# Patient Record
Sex: Male | Born: 1935 | Race: Black or African American | Hispanic: No | Marital: Married | Smoking: Former smoker
Health system: Southern US, Community
[De-identification: ages and names within clinical notes are randomized; demographics above are authoritative.]

## PROBLEM LIST (undated history)

## (undated) DIAGNOSIS — I10 Essential (primary) hypertension: Secondary | ICD-10-CM

## (undated) DIAGNOSIS — G459 Transient cerebral ischemic attack, unspecified: Secondary | ICD-10-CM

## (undated) DIAGNOSIS — I619 Nontraumatic intracerebral hemorrhage, unspecified: Secondary | ICD-10-CM

## (undated) DIAGNOSIS — C61 Malignant neoplasm of prostate: Secondary | ICD-10-CM

## (undated) DIAGNOSIS — I639 Cerebral infarction, unspecified: Secondary | ICD-10-CM

## (undated) DIAGNOSIS — D61818 Other pancytopenia: Secondary | ICD-10-CM

## (undated) DIAGNOSIS — Z515 Encounter for palliative care: Secondary | ICD-10-CM

## (undated) DIAGNOSIS — J9801 Acute bronchospasm: Secondary | ICD-10-CM

## (undated) DIAGNOSIS — C787 Secondary malignant neoplasm of liver and intrahepatic bile duct: Secondary | ICD-10-CM

---

## 2000-07-10 ENCOUNTER — Encounter: Payer: Self-pay | Admitting: Surgery

## 2000-07-10 ENCOUNTER — Emergency Department (HOSPITAL_COMMUNITY): Admission: EM | Admit: 2000-07-10 | Discharge: 2000-07-10 | Payer: Self-pay | Admitting: Emergency Medicine

## 2004-12-08 ENCOUNTER — Emergency Department (HOSPITAL_COMMUNITY): Admission: EM | Admit: 2004-12-08 | Discharge: 2004-12-09 | Payer: Self-pay | Admitting: Emergency Medicine

## 2005-02-19 ENCOUNTER — Inpatient Hospital Stay (HOSPITAL_COMMUNITY): Admission: EM | Admit: 2005-02-19 | Discharge: 2005-02-22 | Payer: Self-pay | Admitting: Emergency Medicine

## 2005-02-19 ENCOUNTER — Ambulatory Visit: Payer: Self-pay | Admitting: Physical Medicine & Rehabilitation

## 2005-02-22 ENCOUNTER — Inpatient Hospital Stay (HOSPITAL_COMMUNITY)
Admission: RE | Admit: 2005-02-22 | Discharge: 2005-03-04 | Payer: Self-pay | Admitting: Physical Medicine & Rehabilitation

## 2005-02-24 ENCOUNTER — Encounter (INDEPENDENT_AMBULATORY_CARE_PROVIDER_SITE_OTHER): Payer: Self-pay | Admitting: Cardiovascular Disease

## 2005-03-07 ENCOUNTER — Encounter
Admission: RE | Admit: 2005-03-07 | Discharge: 2005-04-07 | Payer: Self-pay | Admitting: Physical Medicine & Rehabilitation

## 2005-04-07 ENCOUNTER — Encounter
Admission: RE | Admit: 2005-04-07 | Discharge: 2005-07-06 | Payer: Self-pay | Admitting: Physical Medicine & Rehabilitation

## 2005-04-07 ENCOUNTER — Ambulatory Visit: Payer: Self-pay | Admitting: Physical Medicine & Rehabilitation

## 2005-04-08 ENCOUNTER — Encounter
Admission: RE | Admit: 2005-04-08 | Discharge: 2005-05-05 | Payer: Self-pay | Admitting: Physical Medicine & Rehabilitation

## 2005-06-21 ENCOUNTER — Encounter: Admission: RE | Admit: 2005-06-21 | Discharge: 2005-06-21 | Payer: Self-pay | Admitting: Neurology

## 2005-07-25 ENCOUNTER — Encounter
Admission: RE | Admit: 2005-07-25 | Discharge: 2005-10-23 | Payer: Self-pay | Admitting: Physical Medicine & Rehabilitation

## 2005-08-11 ENCOUNTER — Ambulatory Visit: Payer: Self-pay | Admitting: Physical Medicine & Rehabilitation

## 2007-03-19 ENCOUNTER — Emergency Department (HOSPITAL_COMMUNITY): Admission: EM | Admit: 2007-03-19 | Discharge: 2007-03-19 | Payer: Self-pay | Admitting: Emergency Medicine

## 2007-05-30 ENCOUNTER — Emergency Department (HOSPITAL_COMMUNITY): Admission: EM | Admit: 2007-05-30 | Discharge: 2007-05-30 | Payer: Self-pay | Admitting: Emergency Medicine

## 2008-07-17 ENCOUNTER — Emergency Department (HOSPITAL_COMMUNITY): Admission: EM | Admit: 2008-07-17 | Discharge: 2008-07-17 | Payer: Self-pay | Admitting: Family Medicine

## 2010-03-25 ENCOUNTER — Ambulatory Visit (HOSPITAL_COMMUNITY): Admission: RE | Admit: 2010-03-25 | Discharge: 2010-03-25 | Payer: Self-pay | Admitting: Urology

## 2010-03-26 ENCOUNTER — Emergency Department (HOSPITAL_COMMUNITY): Admission: EM | Admit: 2010-03-26 | Discharge: 2010-03-27 | Payer: Self-pay | Admitting: Occupational Therapy

## 2010-04-02 ENCOUNTER — Emergency Department (HOSPITAL_COMMUNITY): Admission: EM | Admit: 2010-04-02 | Discharge: 2010-04-02 | Payer: Self-pay | Admitting: Family Medicine

## 2010-04-05 ENCOUNTER — Ambulatory Visit: Admission: RE | Admit: 2010-04-05 | Discharge: 2010-04-12 | Payer: Self-pay | Admitting: Radiation Oncology

## 2010-05-02 ENCOUNTER — Emergency Department (HOSPITAL_COMMUNITY): Admission: EM | Admit: 2010-05-02 | Discharge: 2010-05-02 | Payer: Self-pay | Admitting: Family Medicine

## 2010-05-05 ENCOUNTER — Emergency Department (HOSPITAL_COMMUNITY): Admission: EM | Admit: 2010-05-05 | Discharge: 2010-05-05 | Payer: Self-pay | Admitting: Emergency Medicine

## 2010-05-05 ENCOUNTER — Encounter (INDEPENDENT_AMBULATORY_CARE_PROVIDER_SITE_OTHER): Payer: Self-pay | Admitting: Emergency Medicine

## 2010-05-05 ENCOUNTER — Ambulatory Visit: Payer: Self-pay | Admitting: Vascular Surgery

## 2010-05-13 ENCOUNTER — Encounter: Admission: RE | Admit: 2010-05-13 | Discharge: 2010-05-13 | Payer: Self-pay | Admitting: Cardiovascular Disease

## 2010-05-20 ENCOUNTER — Encounter (INDEPENDENT_AMBULATORY_CARE_PROVIDER_SITE_OTHER): Payer: Self-pay | Admitting: Cardiovascular Disease

## 2010-05-20 ENCOUNTER — Ambulatory Visit (HOSPITAL_COMMUNITY): Admission: RE | Admit: 2010-05-20 | Discharge: 2010-05-20 | Payer: Self-pay | Admitting: Cardiovascular Disease

## 2010-09-29 LAB — URINALYSIS, ROUTINE W REFLEX MICROSCOPIC
Ketones, ur: 15 mg/dL — AB
Nitrite: NEGATIVE
Protein, ur: NEGATIVE mg/dL
Urobilinogen, UA: 1 mg/dL (ref 0.0–1.0)

## 2010-12-03 NOTE — Assessment & Plan Note (Signed)
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  Was in rehab in August 2006.  He finished his outpatient  therapy at [facility] in September/October.   DICTATION ENDED HERE      Erick Colace, M.D.  Electronically Signed     AEK/MedQ  D:  08/12/2005 12:14:53  T:  08/12/2005 13:08:27  Job #:  [id number]   cc:   Ricki Rodriguez, M.D.  Fax: [phone]   Pramod P. Pearlean Brownie, MD  Fax: [phone]

## 2010-12-03 NOTE — H&P (Signed)
NAMEROCIO, WOLAK             ACCOUNT NO.:  [id number]   MEDICAL RECORD NO.:  [id number]          PATIENT TYPE:  EMS   LOCATION:  MAJO                         FACILITY:  [facility]   PHYSICIAN:  Pramod P. Pearlean Brownie, MD    DATE OF BIRTH:  [birth year]   DATE OF ADMISSION:  02/19/2005  DATE OF DISCHARGE:                                HISTORY & PHYSICAL   REFERRING PHYSICIAN:  Donnetta Hutching, MD   REASON FOR CONSULTATION:  Left cerebellar hemorrhage.   HISTORY OF PRESENT ILLNESS:  Mr. 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  He suffered nausea and threw  up once after he reached the emergency room.  He presented within 3 hours of  onset of his symptoms. CT scan that was obtained in the ER revealed a 3 x  2.5 cm hemorrhage in the left cerebellum with mild mass effect on the fourth  ventricle without hydrocephalus or intraventricular extension.  The patient  has been awake and alert and following commands.  Blood pressure on  admission has remained 140s to 160s systolic without specific treatment.  The patient denies any previous history of stroke, TIA, seizures, headaches.  No significant neurological problems.   PAST MEDICAL HISTORY:  Significant only for hypertension.   PAST SURGICAL HISTORY:  Cardiac surgery x 2.   HOME MEDICATIONS:  1.  Norvasc 5 mg a day.  2.  Multivitamins once a day.  3.  Aspirin 81 mg a day.   PRIMARY CARE PHYSICIAN:  Ricki Rodriguez, M.D.   PAST SURGICAL HISTORY:  Right foot surgery.   MEDICATION ALLERGIES:  None.   SOCIAL HISTORY:  The patient works as a Social worker.  He  lives at home with his wife.  He smokes one-half per day for 55 years.  He  has five or six alcoholic drinks every day.  He denies doing drugs.   REVIEW OF SYSTEMS:  Not significant or any chest pain, cough, shortness of  breath, diarrhea,  fever.   PHYSICAL EXAMINATION:  GENERAL:  He is a pleasant, middle-aged [place]-  American male who is not in distress.  VITAL SIGNS:  He is afebrile.  Pulse rate 62 per minute, regular respiratory  rate 16 per minute, blood pressure 160/94.  Distal pulses are well felt.  HEAD:  Nontraumatic.  NECK:  Supple without bruit.  ENT:  Exam unremarkable.  CARDIAC:  Exam unremarkable.  LUNGS:  Clear to auscultation.  ABDOMEN:  Soft, nontender.  NEUROLOGIC:  The patient is awake, alert, oriented x3 with normal speech and  language function.  There is no aphasia, apraxia, dysarthria.  Eye movements  are full range without nystagmus.  He has some mild saccadic dysmetria on  horizontal gaze in both directions.  His face has symmetric bilateral  movements and normal.  Tongue is midline.  Motor system exam reveals no  upper extremity drift; however, he has significant ataxia of the left lower  extremity with finger-to-nose incoordination.  There is mild weakness of the  left grip and intrinsic hand muscles.  The lower extremity strength,  reflexes, coordination, sensation all symmetric.  Plantars downgoing.  Gait  was not tested.   DATA REVIEWED TODAY:  Noncontrast CAT scan of the head reveals a 3 x 2.5 cm  left cerebellar hemorrhage with mild surrounding mass effect on the fourth  ventricle but no hydrocephalus or intraventricular extension.  EKG reveals a  normal sinus rhythm.  Admission labs are pending at this time.   IMPRESSION:  A 76 year old male with 3 x 2.5 cm left cerebellar hemorrhage  who has presented within 3 hours of onset of the symptoms.   PLAN:  The patient is at significant risk for repeat hemorrhage within the  next 24 hours.  I have discussed this with the patient and his wife.  The  patient meets inclusion criteria for being treated with NovoSeven  recombinant factor VIIA for decreasing changes of rebleed.  We will give him  40 mcg/kg of NovoSeven x1.  Strict control of  blood pressure, keep systolic  blood pressure below 160.  Will use IV labetalol 20 mg p.r.n. as needed and  Cardene drip as necessary.  He will be admitted to the neurologic intensive  care unit.   I had a long discussion with the patient and his wife with regards to his  prognosis. Discussed chances of further deterioration if there is repeat  hemorrhage or hydrocephalus.  If this happens, he may need a craniotomy or  ventricular catheter.  I have also discussed this case at length with Dr.  Donalee Citrin, neurosurgeon who agrees at the present time treating  conservatively but have a low threshold for surgery if he deteriorates.  He  will be admitted to the neurologic intensive care unit.   I spent 1 hour administering critical care at the patient's bedside,  directing and coordinating his care.       PPS/MEDQ  D:  02/19/2005  T:  02/19/2005  Job:  [id number]   cc:   Donnetta Hutching, MD   Ricki Rodriguez, M.D.  [address]  [place] [address]  Fax: [phone]

## 2010-12-03 NOTE — Assessment & Plan Note (Signed)
HISTORY OF PRESENT ILLNESS:  Left cerebellar hemorrhage, 3 x 2.5 cm, mild  mass effect toward ventricle.  MRI showing moderate intracranial  atherosclerotic type changes.  He was at rehab from February 22, 2005.  His  date of stroke was February 19, 2005.  He has started outpatient rehab.  Has  been there for one month starting March 07, 2005. He has been giving  instructions for no driving 3-32 weeks, no lifting 8-12 weeks.  Medications  are Norvasc 10 mg p.o. daily.  He has two left and has been instructed to  follow up with Dr. Algie Coffer to get refills.  He has not followed up with Dr.  Pearlean Brownie or Dr. Algie Coffer in the interval time.  No follow up.  He is independent  with all his self care and mobility.  He can walk 21 minutes at a time.  He  is able to climb steps and has not driven, but he would like to return to.   PHYSICAL EXAMINATION:  VITAL SIGNS:  Blood pressure 122/62, pulse 74,  respirations 16, O2 saturation 98% on room air.  NEUROLOGICAL:  Gait:  No evidence of toe drag or knee instability.  Has  widened base of support.  He has moderate dysmetria, left finger-to-nose-to  finger.  Mild dysmetria, left heel-to-shin.  Motor strength is full  bilateral upper and lower extremities.  He has no evidence of nystagmus.  Extraocular movements intact.   IMPRESSION:  Left cerebellar hemorrhage with mainly left upper greater than  left lower limb ataxia and mild truncal ataxia.   PLAN:  1.  Continue PT/OT.  2.  Continue no driving.  3.  I will see him back in one month.  4.  Follow up with Dr Algie Coffer from Cardiology to follow up on blood      pressure.  5.  Follow up with Dr. Pearlean Brownie.  Follow up on his cerebellar hemorrhage.      Erick Colace, M.D.  Electronically Signed     AEK/MedQ  D:  04/08/2005 09:44:40  T:  04/08/2005 11:34:35  Job #:  [id number]   cc:   Pramod P. Pearlean Brownie, MD  Fax: [phone]   Ricki Rodriguez, M.D.  Fax: [phone]

## 2010-12-03 NOTE — Consult Note (Signed)
NAMEDALIN, CALDERA NO.:  [id number]   MEDICAL RECORD NO.:  [id number]          PATIENT TYPE:  INP   LOCATION:  3109                         FACILITY:  [facility]   PHYSICIAN:  Donalee Citrin, M.D.        DATE OF BIRTH:  July 14, 1935   DATE OF CONSULTATION:  02/19/2005  DATE OF DISCHARGE:                                   CONSULTATION   REASON FOR CONSULTATION:  Left cerebellar hemorrhage.   HISTORY OF PRESENT ILLNESS:  The patient is a very pleasant 76 year old  gentleman who has past medical history significant for hypertension  maintained on Norvasc by Dr. Algie Coffer who apparently at 10:30 this morning  noticed some difficulty with his coordination and his balance, and it has  been very difficult to ambulate.  The patient was brought to the emergency  room and was evaluated and noted to be markedly ataxic.  Head CT revealed a  3-cm left cerebellar hemorrhage, and neurology and neurosurgery have been  consulted.  The patient currently complains of a little bit of a headache  but mostly just the difficulty walking and coordination difficulties.   PAST MEDICAL HISTORY:  Hypertension.   PAST SURGICAL HISTORY:  Noncontributory.   MEDICATIONS:  Norvasc.   PHYSICAL EXAMINATION:  GENERAL:  Very pleasant, awake and alert, 76 year old  gentleman in no acute distress.  HEENT:  Within normal limits.  He does have equal and reactive pupils.  Extraocular movements appear to be intact.  He does have some nystagmus to  the left and right lateral gaze.  NEUROLOGIC:  The face appears to be  symmetric, and cranial nerves otherwise appear to be intact.  His strength  is 5/5 in his upper and lower extremities.  He has no evidence of pronator  drift.  He does have significant left-sided dysmetria.  I did not ambulate  the patient during this visit.   STUDIES:  His head CT shows a 3-cm acute cerebellar hematoma with some mild  mass effect in the lateral aspect of his fourth ventricle;  however, the  fourth does appear to be wide open.  He has no evidence of hydrocephalus.   ASSESSMENT ON LIMB MOVEMENT:  A 76 year old gentleman with probable  hypertensive hemorrhage that occurred somewhere around 10:30 or 11 o'clock  this morning.  Have recommended neurology evaluation.  Neurology admitted  the patient and recommended intensive care unit observation and followup  head computed tomography scans serially every day, and expectant management  for expansion of hemorrhage might require craniotomy versus ventriculostomy.  Evaluate the patient metabolically.  Rule out any occult coagulopathy.       GC/MEDQ  D:  02/19/2005  T:  02/20/2005  Job:  [id number]

## 2010-12-03 NOTE — Discharge Summary (Signed)
Devin Parker, Devin Parker             ACCOUNT NO.:  [id number]   MEDICAL RECORD NO.:  [id number]          PATIENT TYPE:  REC   LOCATION:  OREH                         FACILITY:  [facility]   PHYSICIAN:  Greg Cutter, P.A. DATE OF BIRTH:  September 16, 1934   DATE OF ADMISSION:  03/07/2005  DATE OF DISCHARGE:  04/07/2005                                 DISCHARGE SUMMARY   NO DICTATION FOR THIS JOB      Greg Cutter, P.A.     PP/MEDQ  D:  05/27/2005  T:  05/28/2005  Job:  [id number]

## 2010-12-03 NOTE — Assessment & Plan Note (Signed)
INTERVAL HISTORY:  Devin Parker returns today.  I last saw him   Dictation ended at this point.      Erick Colace, M.D.  Electronically Signed     AEK/MedQ  D:  08/12/2005 13:13:31  T:  08/12/2005 17:14:40  Job #:  [id number]   cc:   Ricki Rodriguez, M.D.  Fax: [phone]   Pramod P. Pearlean Brownie, MD  Fax: [phone]

## 2010-12-03 NOTE — Assessment & Plan Note (Signed)
HISTORY OF PRESENT ILLNESS:  The patient had a left cerebellar hemorrhage, 3  x 2.5 cm, onset February 19, 2005.  He was at rehab in August 2006.  He has  finished his outpatient therapy at [facility].  He has not  been given an official release to drive, but his wife states he snuck out  and drove about three times without her at the wheel or without her next to  him.  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  He has lifted for our five-pound  packages and states that his maximum load that he would usually carry would  be up to 20-25 pounds.   Prior to his stroke he worked approximately five to six hours a day.   He has had no new medical issues, states that he followed up with Dr.  Algie Coffer but no changes in medication were made.   PHYSICAL EXAMINATION:  VITAL SIGNS:  Blood pressure 150/79, pulse 80,  respirations 16, O2 saturation 98% in room air.  GENERAL:  Mood and affect appropriate.  MUSCULOSKELETAL/NEUROLOGIC:  He has mild dysmetria, finger-nose-finger on  the left side in the upper extremity and mild to moderate in the left lower  extremity.  He has normal coordination on the right side and has normal  strength bilaterally.  His gait is slightly wide-based.  He is able to walk  on his toes without loss of balance.  Speech is without dysarthria.  He has  no visual field deficits.   IMPRESSION:  1.  Left cerebellar cerebrovascular accident with mild upper extremity      dysmetria on the left side only and mild to moderate in the left lower.      Given that he has an automatic transmission, I think there is really no      problem of him returning to driving his minivan and have advised      starting out with his wife or another licensed driver in the passenger      seat with him and start out with a low-congestion area in daytime only.      He agrees to do this.  2.  In terms of return to work, he  more or less has been riding along with      his wife and his children, who are also part of the family business, and      I think he can gradually resume a more active role, and we went over how      to go about this.  3.  The patient's wife asked about alcohol intake for the patient.  I      advised a maximum of one drink a day, i.e., one 12-ounce beer versus 1-      1/2 ounces of 80 proof liquor.  He understands, wife understands as      well.  4.  I will see him back in 2-1/2 months, monitor his gradual return to work      as well as return to drive.      Erick Colace, M.D.  Electronically Signed     AEK/MedQ  D:  05/09/2005 14:44:03  T:  05/10/2005 07:33:46  Job #:  [id number]   cc:   Ricki Rodriguez, M.D.  Fax: [phone]   Pramod P. Pearlean Brownie, MD  Fax: [phone]

## 2010-12-03 NOTE — H&P (Signed)
Devin Parker, Devin Parker NO.:  [id number]   MEDICAL RECORD NO.:  [id number]          PATIENT TYPE:  IPS   LOCATION:  4037                         FACILITY:  [facility]   PHYSICIAN:  Ranelle Oyster, M.D.DATE OF BIRTH:  1935-06-09   DATE OF ADMISSION:  02/22/2005  DATE OF DISCHARGE:                                HISTORY & PHYSICAL   CHIEF COMPLAINT:  Ataxia, left sided weakness.   HISTORY OF PRESENT ILLNESS:  This is a 76 year old black male with a history  of hypertension admitted, on February 19, 2005, with ataxia and some nausea and  vomiting.  Workup revealed a 3 x 2.5-cm hemorrhage in the left cerebellum  with some mass effect upon the fourth ventricle.  The patient was evaluated  by neurosurgery and neurology and conservative management was recommended.  The patient's blood pressure was controlled.  He has been participating with  therapy and remains at a min to mod assist for basic transfers with  significant ataxia to the left.   REVIEW OF SYSTEMS:  The patient reports normal vision.  Denies nausea and  vomiting.  He has had some loss of balance to the left and problem leaning  to the right when he sits.  He denies shortness of breath, chest pain, or  headache.  Vision has been intact.  He denies vertigo.  A full review of  systems is in the written H&P.   PAST MEDICAL HISTORY:  1.  Hypertension.  2.  Right lower extremity fracture.   FAMILY HISTORY:  Positive for diabetes and cancer.   SOCIAL HISTORY:  The patient is married.  He has a supportive wife and two  daughters who can assist him somewhat.  He owns a Higher education careers adviser.  He was  independent prior to arrival.  Tobacco is half pack per day and he drinks 5-  6 times a day.  Wife is able to support him as needed.   FUNCTIONAL HISTORY:  Notable for full independence prior to arrival.  He was  able to drive and climb steps.   MEDICATIONS:  Prior to arrival include Norvasc, vitamin B.   ALLERGIES:   SOAPS and CREAMS.   PHYSICAL EXAMINATION:  VITAL SIGNS:  Temperature is 97.8, pulse is 80,  respiratory rate is 16, blood pressure is 150/80.  GENERAL:  The patient is pleasant, no acute distress.  He is sitting on the  bedside with fair truncal control.  He does tend to lean to the right until  cued.  HEENT:  Pupils are equal, round, and reactive to light and accommodation.  Extraocular eye movements are intact.  Ear, nose, and throat exam was  unremarkable.  NECK:  Supple without JVD or lymphadenopathy.  CHEST:  Clear to auscultation without wheezes, rales, or rhonchi.  HEART:  Regular rate and rhythm without murmurs, rubs, or gallops.  ABDOMEN:  Soft, nontender.  Bowel sounds are positive.  SKIN:  Intact.  No signs of breakdown.  No edema was noted in the  extremities.  NEUROLOGIC:  The patient's cranial nerve exam was intact.  He may have a  mild  left central VII.  No nystagmus or vertigo was elicited.  His range of  motion both passively and actively was intact.  Sensory exam was 2/2.  Reflexes were 1+ throughout.  The patient had notable left sided ataxia with  finger-to-nose and heel-to-shin movements.  When he stood, he tended to lean  significantly to the left and needed min assist to mod assist for support at  times.  His truncal control was fair to good with lean towards the right  noted as above.  Cognitively, he had excellent judgment, orientation, memory  and affect was appropriate.  Strength was generally 5/5 throughout with  possibly some trace weakness on the left side.  The patient did have a  notable dysarthria.   ASSESSMENT/PLAN:  1.  Functional deficits secondary to left cerebellar hemorrhage with left      sided ataxia, dysarthria, and balance deficits.  Begin comprehensive      inpatient rehabilitation with modified independent supervision goals.      Family is supportive.  Estimated length of stay is seven to ten days      maximum.  Prognosis is fair to  good.  2.  Deep vein thrombosis prophylaxis with mobility.  3.  Hypertension.  Continue current medications and monitor clinically.       ZTS/MEDQ  D:  02/22/2005  T:  02/22/2005  Job:  [id number]

## 2010-12-03 NOTE — Discharge Summary (Signed)
NAMEMISCHA, Devin Parker             ACCOUNT NO.:  [id number]   MEDICAL RECORD NO.:  [id number]          PATIENT TYPE:  INP   LOCATION:  3022                         FACILITY:  [facility]   PHYSICIAN:  Pramod P. Pearlean Brownie, MD    DATE OF BIRTH:  28-Feb-1935   DATE OF ADMISSION:  02/19/2005  DATE OF DISCHARGE:  02/22/2005                                 DISCHARGE SUMMARY   DIAGNOSES:  1.  Left cerebellar hemorrhage secondary to hypertension.  2.  Hypertension.  3.  Smoker abuser.   DISCHARGE MEDICATIONS:  1.  Norvasc 10 mg daily.  2.  Thiamine 100 mg daily.   PROCEDURE:  1.  CT of the brain on admission showed a 3 x 2.5 cm left cerebellar      hemorrhage with mild mass effect on the fourth ventricle. No      hydrocephalus or intraventricular extension.  2.  MRA of the brain shows moderate intracranial atherosclerotic type of      changes.  3.  MRI of the brain shows left cerebellar hemorrhage 3 x 5 x 3 x 3 with      surrounding vasogenic edema. It is causing mass effect from the left      posterior aspect of the fourth ventricle with some narrowing. There is      mild downward herniation of the cerebellar peduncle's. There is marked      amount of specific white matter type of changes in the periventricular      and subcortical regions as well as in the pons.  4.  EKG shows normal sinus rhythm.   LABORATORY DATA:  Homocystine 11.95, cholesterol 183, triglycerides 69, HDL  74, and LDL 95. Chemistry was normal except for liver function studies with  AST 212, ALT 121, alkaline phosphatase 56, and total bilirubin 1.2.  Hemoglobin was 13.2. Hematocrit 38.7. White blood cell count 3.5 and red  blood cells 4.01. MCV was 96.4 and platelets were 128,000. Hemoglobin A1C  was 5.2. Urine drug screen was negative.   HISTORY OF PRESENT ILLNESS:  Devin Parker is a 76 year old right  handed black male who has a history of hypertension and is maintained on  Norvasc. On the morning of admission he  noticed some difficulty with  coordination and balance and he has had difficulty ambulating. He was  brought to the emergency room and was evaluated and found to be markedly  ataxic. CT of the head revealed a 3 cm left cerebellar hemorrhage and  neurology and neurosurgery were consulted. The patient was not a surgical  candidate and neurology admitted for further stroke evaluation.   HOSPITAL COURSE:  Neurology continued to follow but again, no surgical  intervention necessitated and they signed off. Followup MRI of the brain  showed stable hemorrhage. Blood pressure medicines from home were doubled  and blood pressure is ranging in the 130's to 140's over 70's to 90's. Of  note, the patient is a heavy drinker but has had no signs or symptoms of  DT's in hospital. He was evaluated by rehabilitation and they agreed that he  would be  a good inpatient rehabilitation candidate. The patient was asked to  stop smoking and was transferred to rehabilitation for continuation of  therapies.   CONDITION ON DISCHARGE:  The patient alert and oriented x3. Speech clear. No  aphasia. His face is symmetric. His extraocular movements are intact, though  he does have some mild saccadic dysmetric eye movements. He has ataxia in  his left upper extremity with finger-to-nose-finger and his left lower  extremity with heel shin. He has a very slight mild left drift. Strength is  normal. His chest is clear to auscultation and his heart rate is regular.   DISCHARGE PLANS:  1.  Transfer to rehabilitation for continuation of therapies.  2.  Resume aspirin.  3.  Return visit with Dr. Pearlean Brownie in 2 months if remains stable.  4.  Stop smoking.  5.  Followup with primary medical doctor in 1 month from discharge.  6.  Control blood pressure.      S   SB/MEDQ  D:  02/22/2005  T:  02/22/2005  Job:  [id number]   cc:   Pramod P. Pearlean Brownie, MD  Fax: [phone]   Ricki Rodriguez, M.D.  [address]  [place]  [address]  Fax: [phone]

## 2010-12-03 NOTE — Discharge Summary (Signed)
NAMEGEFFREY, MICHAELSEN             ACCOUNT NO.:  [id number]   MEDICAL RECORD NO.:  [id number]          PATIENT TYPE:  IPS   LOCATION:  4037                         FACILITY:  [facility]   PHYSICIAN:  Erick Colace, M.D.DATE OF BIRTH:  Apr 19, 1935   DATE OF ADMISSION:  02/22/2005  DATE OF DISCHARGE:  03/04/2005                                 DISCHARGE SUMMARY   DISCHARGE DIAGNOSES:  1.  Left cerebellar bleed with mild ataxia.  2.  Hypertension.  3.  Abnormal liver function tests.   HISTORY OF PRESENT ILLNESS:  Mr. Lipsey is as 76 year old man with a  history of hypertension admitted on August 5th with ataxia .  He was noted  to have an episode of nausea and vomiting. CT of the head done showed a 3 x  2.5 cm hemorrhage in the left cerebellum with mild mass effect of fourth  ventricle, no hydrocephalus. MRI showed left cerebellar hemorrhage with mass  effect and mild herniation of cerebellar peduncles. cerebral cortex with  moderate atrophy. The patient  is to follow up with neurologist after  discharge for repeat MRI. . . He continued to have complaints of neck pain.  He had abnormal LFTs noted on admission with SGOT of 212, SGPT 121, alkaline  phosphatase 56. Currently the patient's mobility and ADL are impaired with  rehab consulted for further therapy.   PAST MEDICAL HISTORY:  Significant for hypertension.   ALLERGIES:  SOAPS and CREAMS.   FAMILY HISTORY:  Positive for diabetes and cancer.   SOCIAL HISTORY:  The patient is married, was working independently prior to  admission. He was self-employed earlier. Wife ___________ past discharge. He  smokes half pack a day and he reports he drinks two alcoholic drinks daily.  Lives in a one-level home with four steps inside.   HOSPITAL COURSE:  Mr. Floy Riegler was admitted to rehab on February 22, 2005, when the patient's therapies consist of PT, OT, and speech therapy. At  the time of admission strength was noted equal in  bilateral upper and lower  extremities. He was noted to have severe ataxia, left upper extremity  greater than left lower extremity. Blood pressures were monitored on a twice  a day basis and showed reasonable control from the 120s to 160 systolic and  70s to an occasional high of 90s diastolic. Carotid Dopplers were done post  admission showing right to have no ICA stenosis, left to have 40% to 50% ICA  stenosis.  Cardiac echo done showed left ventricular systolic function  normal, LVEF at 55% to 65% with moderate hypokinesis at the base of the mid  septal wall. Mild mitral valve regurgitation and mild to moderate tricuspid  valve regurgitation.   Labs done post admission showed hemoglobin of 13.6, hematocrit 40.5, white  count 5.0, platelet count 136,000. Check of lytes revealed sodium of 152,  potassium 3.8, chloride 97, CO2 27, BUN 9, creatinine 0.7, glucose 107. LFTs  were noted to be improving with AST of 92, ALT of 99, alkaline phosphatase  70, and total bilirubin of 0.9.   The patient was continent  of bowel and bladder during his stay.  Speech  therapy was done showing patient's basic  comprehension intact and basic  expression to be intact with minimal dysarthria. Mild deficits of recall to  10 minutes were noted. During his stay in rehab Mr. Gamino progressed to  being at supervision with transfers, minimal assist ambulating greater than  500 feet through community, minimal assistance with navigating stairs. He  was at set up with supervision with ADL, required supervision for toileting.  he was able to ambulate with minimal assist secondary to decreased balance  and occasionally decrease in left hand extension. He continues to have  decreased in safety insight with fall risks being an issue. Family is aware  of need for supervision and will continue to provide this past discharge.  Further follow-up therapies to include outpatient PT and OT at [facility]  outpatient rehab  beginning on March 07, 2005. On March 04, 2005, the  patient is discharged to home.   DISCHARGE MEDICATIONS:  1.  Norvasc 10 mg daily.  2.  Multivitamin once a day.  3.  Senokot-S two p.o. q.h.s.   ACTIVITY:  24 hour supervision assistance.   DIET:  Regular.   SPECIAL INSTRUCTIONS:  No driving, no lifting for up to 12 weeks.   FOLLOWUP:  The patient is to follow up with Dr. Algie Coffer and Dr. Pearlean Brownie in the  next four to six weeks, to follow up with Dr. Doroteo Bradford on September 22nd at  9:15.      Greg Cutter, P.A.      Erick Colace, M.D.  Electronically Signed    PP/MEDQ  D:  05/27/2005  T:  05/28/2005  Job:  [id number]   cc:   Ricki Rodriguez, M.D.  Fax: [phone]   Pramod P. Pearlean Brownie, MD  Fax: [phone]

## 2011-02-28 ENCOUNTER — Encounter: Payer: Self-pay | Admitting: Podiatrist

## 2011-04-29 LAB — I-STAT 8, (EC8 V) (CONVERTED LAB)
HCT: 42
Hemoglobin: 14.3
Operator id: 270111
Potassium: 3.9
Sodium: 135
TCO2: 26
pH, Ven: 7.4 — ABNORMAL HIGH

## 2011-04-29 LAB — DIFFERENTIAL
Basophils Absolute: 0.1
Basophils Relative: 1
Monocytes Absolute: 0.7
Neutro Abs: 2.8
Neutrophils Relative %: 57

## 2011-04-29 LAB — CBC
MCHC: 34.2
Platelets: 123 — ABNORMAL LOW
RDW: 14.1 — ABNORMAL HIGH

## 2011-04-29 LAB — POCT CARDIAC MARKERS
CKMB, poc: 1.5
Troponin i, poc: <0.05

## 2011-04-29 LAB — POCT I-STAT CREATININE: Operator id: 270111

## 2012-01-04 ENCOUNTER — Encounter: Payer: Self-pay | Admitting: *Deleted

## 2012-05-07 ENCOUNTER — Encounter (HOSPITAL_COMMUNITY): Payer: Self-pay | Admitting: *Deleted

## 2012-05-07 ENCOUNTER — Emergency Department (HOSPITAL_COMMUNITY)
Admission: EM | Admit: 2012-05-07 | Discharge: 2012-05-07 | Disposition: A | Discharge status: Home / Self Care | Payer: Medicare Other | Attending: Emergency Medicine | Admitting: Emergency Medicine

## 2012-05-07 ENCOUNTER — Emergency Department (HOSPITAL_COMMUNITY): Payer: Medicare Other

## 2012-05-07 DIAGNOSIS — M25569 Pain in unspecified knee: Secondary | ICD-10-CM | POA: Diagnosis present

## 2012-05-07 DIAGNOSIS — M25469 Effusion, unspecified knee: Secondary | ICD-10-CM | POA: Insufficient documentation

## 2012-05-07 DIAGNOSIS — Z8673 Personal history of transient ischemic attack (TIA), and cerebral infarction without residual deficits: Secondary | ICD-10-CM | POA: Insufficient documentation

## 2012-05-07 DIAGNOSIS — Z8546 Personal history of malignant neoplasm of prostate: Secondary | ICD-10-CM | POA: Insufficient documentation

## 2012-05-07 DIAGNOSIS — M25461 Effusion, right knee: Secondary | ICD-10-CM

## 2012-05-07 HISTORY — DX: Transient cerebral ischemic attack, unspecified: G45.9

## 2012-05-07 HISTORY — DX: Malignant neoplasm of prostate: C61

## 2012-05-07 HISTORY — DX: Cerebral infarction, unspecified: I63.9

## 2012-05-07 MED ORDER — HYDROCODONE-ACETAMINOPHEN 5-325 MG PO TABS: 1.0000 | ORAL_TABLET | ORAL | Status: DC | PRN

## 2012-05-07 NOTE — ED Provider Notes (Signed)
Medical screening examination/treatment/procedure(s) were conducted as a shared visit with non-physician practitioner(s) and myself.  I personally evaluated the patient during the encounter  Flint Melter, MD 05/07/12 1714

## 2012-05-07 NOTE — ED Provider Notes (Signed)
Devin Parker is a 77 y.o. male  with atraumatic. Right knee pain, and swelling. He has had this previously, and was treated with a cortisone shot by his orthopedist. No systemic symptoms. Right knee. Exam: Effusion present by palpation. Patella is not ballotable. He resists motion secondary to pain. Knee extension is normal.   Knee immobilizer, ordered  Prescription for Norco  Recommended followup with orthopedist   Medical screening examination/treatment/procedure(s) were conducted as a shared visit with non-physician practitioner(s) and myself.  I personally evaluated the patient during the encounter  Flint Melter, MD 05/07/12 [phone]

## 2012-05-07 NOTE — ED Provider Notes (Signed)
History     CSN: [id number]  Arrival date & time 05/07/12  1335   First MD Initiated Contact with Patient 05/07/12 1417      Chief Complaint  Patient presents with  . Knee Pain    (Consider location/radiation/quality/duration/timing/severity/associated sxs/prior treatment) HPI Comments: This is a 77 year old male, who presents with right knee pain.  Patient states that the pain began on Friday.  He denies any trauma. He has not taken anything for the pain.  He states that the pain has been worsening, and that it is difficult to move.  His symptoms do not radiate.  He denies pain with rest.  The history is provided by the patient. No language interpreter was used.    Past Medical History  Diagnosis Date  . CVA (cerebral infarction)   . TIA (transient ischemic attack)   . Prostate cancer     History reviewed. No pertinent past surgical history.  No family history on file.  History  Substance Use Topics  . Smoking status: Not on file  . Smokeless tobacco: Not on file  . Alcohol Use: No      Review of Systems  Constitutional: Negative for fever.  HENT: Negative for rhinorrhea.   Eyes: Negative for visual disturbance.  Respiratory: Negative for chest tightness and shortness of breath.   Cardiovascular: Negative for chest pain.  Gastrointestinal: Negative for abdominal pain.  Musculoskeletal: Negative for back pain.  Skin: Negative for rash.  Neurological: Negative for weakness.  Psychiatric/Behavioral: The patient is not nervous/anxious.   All other systems reviewed and are negative.    Allergies  Penicillins  Home Medications   Current Outpatient Rx  Name Route Sig Dispense Refill  . VITAMIN D 1000 UNITS PO TABS Oral Take 1,000 Units by mouth 3 (three) times a week.    . CENTRUM SILVER ADULT 50+ PO Oral Take 1 tablet by mouth daily.      BP 119/64  Pulse 69  Temp 99.4 F (37.4 C) (Oral)  Resp 18  SpO2 95%  Physical Exam  Nursing note and vitals  reviewed. Constitutional: He is oriented to person, place, and time. He appears well-developed and well-nourished.  HENT:  Head: Normocephalic and atraumatic.  Eyes: Conjunctivae normal and EOM are normal. Pupils are equal, round, and reactive to light.  Neck: Normal range of motion. Neck supple.  Cardiovascular: Normal rate, regular rhythm and normal heart sounds.   Pulmonary/Chest: Effort normal and breath sounds normal.  Abdominal: Soft. Bowel sounds are normal.  Musculoskeletal: He exhibits tenderness.       Mildly swollen right knee, ROM and strength limited 2/2 pain.  No erythema or signs of infection or septic joint.  Tender to palpation over medial joint line.    Neurological: He is alert and oriented to person, place, and time.  Skin: Skin is warm and dry.  Psychiatric: He has a normal mood and affect. His behavior is normal. Judgment and thought content normal.    ED Course  Procedures (including critical care time)  Labs Reviewed - No data to display No results found. Results for orders placed during the hospital encounter of 05/05/10  URINALYSIS, ROUTINE W REFLEX MICROSCOPIC      Component Value Range   Color, Urine ORANGE BIOCHEMICALS MAY BE AFFECTED BY COLOR (*) YELLOW   APPearance CLEAR  CLEAR   Specific Gravity, Urine 1.022  1.005 - 1.030   pH 5.5  5.0 - 8.0   Glucose, UA NEGATIVE  NEGATIVE  mg/dL   Hgb urine dipstick NEGATIVE  NEGATIVE   Bilirubin Urine MODERATE (*) NEGATIVE   Ketones, ur 15 (*) NEGATIVE mg/dL   Protein, ur NEGATIVE  NEGATIVE mg/dL   Urobilinogen, UA 1.0  0.0 - 1.0 mg/dL   Nitrite NEGATIVE  NEGATIVE   Leukocytes, UA    NEGATIVE   Value: NEGATIVE MICROSCOPIC NOT DONE ON URINES WITH NEGATIVE PROTEIN, BLOOD, LEUKOCYTES, NITRITE, OR GLUCOSE <1000 mg/dL.   Dg Knee Complete 4 Views Right  05/07/2012  *RADIOLOGY REPORT*  Clinical Data: Right knee pain and swelling for 4 days.  No known injury.  RIGHT KNEE - COMPLETE 4+ VIEW  Comparison: Lower leg  radiographs 06/21/2005.  Findings: The mineralization and alignment are normal.  There is no evidence of acute fracture or dislocation.  There is mild patellofemoral joint space loss.  No significant joint effusion is seen.  There are diffuse vascular calcifications.  IMPRESSION: No acute osseous findings.  Diffuse vascular calcifications.   Original Report Authenticated By: Gerrianne Scale, M.D.       1. Knee pain   2. Knee effusion, right       MDM   77 year old male, with osteoarthritis of the right knee.  Plain films reveal no acute process as detailed above.  I am going to discharge the patient with Tylenol for pain, and refer him to his Orthopedist, Dr. Cleophas Dunker.    4:52 PM This patient was also seen by Dr. Effie Shy, who handled their disposition.       Roxy Horseman, PA-C 05/07/12 [phone]

## 2012-05-07 NOTE — Progress Notes (Signed)
Orthopedic Tech Progress Note Patient Details:  Devin Parker 11-23-1934 [id number]  Ortho Devices Type of Ortho Device: Knee Immobilizer Ortho Device/Splint Location: right leg Ortho Device/Splint Interventions: Application   Devin Parker 05/07/2012, 5:00 PM

## 2012-05-07 NOTE — ED Notes (Signed)
Pt here by EMS from home for evaluation of right knee pain.  Not associated with any trauma.

## 2012-11-02 ENCOUNTER — Emergency Department (HOSPITAL_COMMUNITY)
Admission: EM | Admit: 2012-11-02 | Discharge: 2012-11-02 | Disposition: A | Discharge status: Home / Self Care | Attending: Emergency Medicine | Admitting: Emergency Medicine

## 2012-11-02 ENCOUNTER — Emergency Department (HOSPITAL_COMMUNITY)

## 2012-11-02 ENCOUNTER — Encounter (HOSPITAL_COMMUNITY): Payer: Self-pay | Admitting: *Deleted

## 2012-11-02 DIAGNOSIS — Z8673 Personal history of transient ischemic attack (TIA), and cerebral infarction without residual deficits: Secondary | ICD-10-CM | POA: Insufficient documentation

## 2012-11-02 DIAGNOSIS — R609 Edema, unspecified: Secondary | ICD-10-CM | POA: Insufficient documentation

## 2012-11-02 DIAGNOSIS — R600 Localized edema: Secondary | ICD-10-CM

## 2012-11-02 DIAGNOSIS — I10 Essential (primary) hypertension: Secondary | ICD-10-CM | POA: Insufficient documentation

## 2012-11-02 DIAGNOSIS — Z8546 Personal history of malignant neoplasm of prostate: Secondary | ICD-10-CM | POA: Insufficient documentation

## 2012-11-02 DIAGNOSIS — M7989 Other specified soft tissue disorders: Secondary | ICD-10-CM | POA: Insufficient documentation

## 2012-11-02 HISTORY — DX: Essential (primary) hypertension: I10

## 2012-11-02 LAB — COMPREHENSIVE METABOLIC PANEL
ALT: 40 U/L (ref 0–53)
CO2: 23 mEq/L (ref 19–32)
Calcium: 9.3 mg/dL (ref 8.4–10.5)
Chloride: 93 mEq/L — ABNORMAL LOW (ref 96–112)
GFR calc Af Amer: >90 mL/min (ref >90)
GFR calc non Af Amer: >90 mL/min (ref >90)
Glucose, Bld: 89 mg/dL (ref 70–99)
Sodium: 133 mEq/L — ABNORMAL LOW (ref 135–145)
Total Bilirubin: 1.3 mg/dL — ABNORMAL HIGH (ref 0.3–1.2)

## 2012-11-02 LAB — CBC WITH DIFFERENTIAL/PLATELET
Eosinophils Relative: 3 % (ref 0–5)
HCT: 34.4 % — ABNORMAL LOW (ref 39.0–52.0)
Lymphocytes Relative: 32 % (ref 12–46)
Lymphs Abs: 1.5 10*3/uL (ref 0.7–4.0)
MCV: 100.6 fL — ABNORMAL HIGH (ref 78.0–100.0)
Monocytes Absolute: 0.7 10*3/uL (ref 0.1–1.0)
Neutro Abs: 2.2 10*3/uL (ref 1.7–7.7)
Platelets: 113 10*3/uL — ABNORMAL LOW (ref 150–400)
RBC: 3.42 MIL/uL — ABNORMAL LOW (ref 4.22–5.81)
WBC: 4.5 10*3/uL (ref 4.0–10.5)

## 2012-11-02 LAB — POCT I-STAT TROPONIN I

## 2012-11-02 MED ORDER — FUROSEMIDE 20 MG PO TABS: 20.0000 mg | ORAL_TABLET | Freq: Every day | ORAL | Status: DC

## 2012-11-02 NOTE — ED Notes (Signed)
Triage completed by Oliver Hum

## 2012-11-02 NOTE — ED Provider Notes (Signed)
History     CSN: [id number]  Arrival date & time 11/02/12  4098   First MD Initiated Contact with Patient 11/02/12 1024      Chief Complaint  Patient presents with  . Hypotension  . Leg Swelling    (Consider location/radiation/quality/duration/timing/severity/associated sxs/prior treatment) HPI  78 year old male with history of prior stroke, TIAs, prostate cancer, and hypertension who was brought to the ER for evaluations of pedal edema. History was obtained through patient and through wife who is at bedside. Otherwise, she has noticed increased pedal edema to both of his legs ongoing for the past 2 weeks. She also noticed that his blood pressure drops from 120 systolic to 110 systolic. This morning patient was complaining of mild tingling sensation to the feet that lasted for less than an hour and resolved without treatment. However wife to suggest patient to followup in the ER for further management. Patient otherwise denies fever, chills, headache, lightheadedness, dizziness, chest pain, shortness of breath, back pain, abdominal pain, nausea, vomiting, diarrhea, dysuria, increased numbness or weakness. He does not have a history of congestive heart failure. He had a cardiac echo performed in 2011 that shows 60-70%  ejection fraction rate. Patient does not take any diuretic. He has no significant prior history of DVT or PE. No recent surgery, prolonged rest, long trip, calf pain. Has history of prior stroke back in 2006 and 2011 with mild left-sided residual weakness. Patient states he can ambulate at home by himself.  Past Medical History  Diagnosis Date  . CVA (cerebral infarction)   . TIA (transient ischemic attack)   . Prostate cancer   . Hypertension   . Prostate cancer     History reviewed. No pertinent past surgical history.  No family history on file.  History  Substance Use Topics  . Smoking status: Not on file  . Smokeless tobacco: Not on file  . Alcohol Use: No       Review of Systems  Constitutional:       A complete 10 system review of systems was obtained and all systems are negative except as noted in the HPI and PMH.    Allergies  Penicillins  Home Medications   Current Outpatient Rx  Name  Route  Sig  Dispense  Refill  . cholecalciferol (VITAMIN D) 1000 UNITS tablet   Oral   Take 1,000 Units by mouth 3 (three) times a week.         Marland Kitchen HYDROcodone-acetaminophen (NORCO/VICODIN) 5-325 MG per tablet   Oral   Take 1 tablet by mouth every 4 (four) hours as needed for pain.   20 tablet   0   . Multiple Vitamins-Minerals (CENTRUM SILVER ADULT 50+ PO)   Oral   Take 1 tablet by mouth daily.           BP 127/73  Pulse 72  Temp(Src) 97.7 F (36.5 C) (Oral)  Resp 20  Ht 6\' 5"  (1.956 m)  Wt 180 lb (81.647 kg)  BMI 21.34 kg/m2  SpO2 98%  Physical Exam  Nursing note and vitals reviewed. Constitutional: He appears well-developed and well-nourished. No distress.  Awake, alert, nontoxic appearance  HENT:  Head: Atraumatic.  Mouth/Throat: Oropharynx is clear and moist.  Eyes: Conjunctivae are normal. Right eye exhibits no discharge. Left eye exhibits no discharge.  Neck: Normal range of motion. Neck supple.  No JVD  Cardiovascular: Normal rate and regular rhythm.   Pulmonary/Chest: Effort normal. No respiratory distress. He exhibits no tenderness.  Abdominal: Soft. There is no tenderness. There is no rebound.  Musculoskeletal: He exhibits edema (bilateral lower extremities with 2+ pitting edema.  no palpable cords, erythema, Homans sign negative. Brisk cap refills palpable pedal pulse). He exhibits no tenderness.  ROM appears intact, no obvious focal weakness  Neurological: He is alert.  Skin: Skin is warm and dry. No rash noted.  Psychiatric: He has a normal mood and affect.    ED Course  Procedures (including critical care time)   Date: 11/02/2012  Rate: 71  Rhythm: normal sinus rhythm  QRS Axis: normal   Intervals: PR prolonged  ST/T Wave abnormalities: nonspecific ST/T changes  Conduction Disutrbances:first-degree A-V block   Narrative Interpretation:   Old EKG Reviewed: unchanged    10:42 AM Patient presents with bilateral lower extremities pitting edema. He is currently afebrile with stable normal vital sign. Workup initiated.    Pt also is a hospice pt due to hx of malignant prostate cancer.  He is however a full code.  He has a known hx of alcohol abuse.  Does have history of ascites. He has elevated AST of 177 ALT of 40 and alkaline phosphatase of 132 his albumin is 3.4 in the setting of liver problem secondary to alcoholic use, suspect his pedal edema is likely related to it.  I have low suspicion for DVT. He has normal pro BNP, and normal renal function.  I discussed the care with my attending who agrees with plan. Plan is to have patient take a low dose of Lasix for the next week and to follow up with Dr. for further management. Otherwise patient is stable for discharge.  Alcohol cessation discussed. Patient voiced understanding and agrees with plan. All questions answered to patient's and family's satisfaction.  Labs Reviewed  CBC WITH DIFFERENTIAL - Abnormal; Notable for the following:    RBC 3.42 (*)    Hemoglobin 12.5 (*)    HCT 34.4 (*)    MCV 100.6 (*)    MCH 36.5 (*)    MCHC 36.3 (*)    Monocytes Relative 15 (*)    All other components within normal limits  COMPREHENSIVE METABOLIC PANEL - Abnormal; Notable for the following:    Sodium 133 (*)    Chloride 93 (*)    BUN 5 (*)    Albumin 3.4 (*)    AST 177 (*)    Alkaline Phosphatase 132 (*)    Total Bilirubin 1.3 (*)    All other components within normal limits  PRO B NATRIURETIC PEPTIDE  POCT I-STAT TROPONIN I   Dg Chest 2 View  11/02/2012  *RADIOLOGY REPORT*  Clinical Data: Hypotension and leg swelling  CHEST - 2 VIEW  Comparison: 05/20/2010  Findings: Heart size is normal.  No pleural effusion or edema  identified.  There is no airspace consolidation identified.  Mild spondylosis noted within the thoracic spine.  IMPRESSION:  1.  No acute cardiopulmonary abnormalities.   Original Report Authenticated By: Signa Kell, M.D.      1. Pedal edema       MDM  BP 126/74  Pulse 75  Temp(Src) 97.7 F (36.5 C) (Oral)  Resp 18  Ht 6\' 5"  (1.956 m)  Wt 180 lb (81.647 kg)  BMI 21.34 kg/m2  SpO2 98%  I have reviewed nursing notes and vital signs. I personally reviewed the imaging tests through PACS system  I reviewed available ER/hospitalization records thought the EMR         Fayrene Helper,  PA-C 11/02/12 1124

## 2012-11-02 NOTE — ED Provider Notes (Signed)
Medical screening examination/treatment/procedure(s) were performed by non-physician practitioner and as supervising physician I was immediately available for consultation/collaboration.    Nelia Shi, MD 11/02/12 1134

## 2012-11-02 NOTE — ED Notes (Signed)
Onset two weeks patient not eating well per his wife. Here today for evaluation and patient having hypotension and edema bilateral feet. Denies pain currently.

## 2012-11-02 NOTE — ED Notes (Signed)
NAD noted at time of d/c home 

## 2012-11-02 NOTE — ED Notes (Signed)
Wife reports pt has had decrease in appetite over the last month and has lost 10 pounds. She is also concerned that pts SBP has decreased from 120's to 110 over the last 2 days.

## 2013-02-07 ENCOUNTER — Encounter (HOSPITAL_COMMUNITY): Payer: Self-pay | Admitting: *Deleted

## 2013-02-07 ENCOUNTER — Emergency Department (HOSPITAL_COMMUNITY)
Admission: EM | Admit: 2013-02-07 | Discharge: 2013-02-07 | Disposition: A | Discharge status: Home / Self Care | Attending: Emergency Medicine | Admitting: Emergency Medicine

## 2013-02-07 ENCOUNTER — Emergency Department (HOSPITAL_COMMUNITY)

## 2013-02-07 DIAGNOSIS — E872 Acidosis, unspecified: Secondary | ICD-10-CM | POA: Insufficient documentation

## 2013-02-07 DIAGNOSIS — E860 Dehydration: Secondary | ICD-10-CM | POA: Diagnosis not present

## 2013-02-07 DIAGNOSIS — Z8673 Personal history of transient ischemic attack (TIA), and cerebral infarction without residual deficits: Secondary | ICD-10-CM | POA: Insufficient documentation

## 2013-02-07 DIAGNOSIS — R609 Edema, unspecified: Secondary | ICD-10-CM | POA: Insufficient documentation

## 2013-02-07 DIAGNOSIS — Z8546 Personal history of malignant neoplasm of prostate: Secondary | ICD-10-CM | POA: Diagnosis not present

## 2013-02-07 DIAGNOSIS — Z87891 Personal history of nicotine dependence: Secondary | ICD-10-CM | POA: Insufficient documentation

## 2013-02-07 DIAGNOSIS — R5381 Other malaise: Secondary | ICD-10-CM | POA: Insufficient documentation

## 2013-02-07 DIAGNOSIS — I959 Hypotension, unspecified: Secondary | ICD-10-CM | POA: Diagnosis present

## 2013-02-07 DIAGNOSIS — I10 Essential (primary) hypertension: Secondary | ICD-10-CM | POA: Insufficient documentation

## 2013-02-07 LAB — CBC WITH DIFFERENTIAL/PLATELET
Basophils Absolute: 0 10*3/uL (ref 0.0–0.1)
Basophils Relative: 1 % (ref 0–1)
Eosinophils Absolute: 0.1 10*3/uL (ref 0.0–0.7)
Eosinophils Relative: 1 % (ref 0–5)
HCT: 31.4 % — ABNORMAL LOW (ref 39.0–52.0)
Hemoglobin: 11.2 g/dL — ABNORMAL LOW (ref 13.0–17.0)
MCH: 34.6 pg — ABNORMAL HIGH (ref 26.0–34.0)
MCHC: 35.7 g/dL (ref 30.0–36.0)
MCV: 96.9 fL (ref 78.0–100.0)
Monocytes Absolute: 0.7 10*3/uL (ref 0.1–1.0)
Monocytes Relative: 12 % (ref 3–12)
Neutro Abs: 4.1 10*3/uL (ref 1.7–7.7)
RDW: 15.2 % (ref 11.5–15.5)

## 2013-02-07 LAB — URINALYSIS, ROUTINE W REFLEX MICROSCOPIC
Hgb urine dipstick: NEGATIVE
Nitrite: NEGATIVE
Protein, ur: NEGATIVE mg/dL
Specific Gravity, Urine: 1.011 (ref 1.005–1.030)
Urobilinogen, UA: 4 mg/dL — ABNORMAL HIGH (ref 0.0–1.0)

## 2013-02-07 LAB — CG4 I-STAT (LACTIC ACID)
Lactic Acid, Venous: 3.72 mmol/L — ABNORMAL HIGH (ref 0.5–2.2)
Lactic Acid, Venous: 1.68 mmol/L (ref 0.5–2.2)

## 2013-02-07 LAB — TROPONIN I: Troponin I: <0.3 ng/mL (ref <0.30)

## 2013-02-07 LAB — COMPREHENSIVE METABOLIC PANEL
AST: 138 U/L — ABNORMAL HIGH (ref 0–37)
Albumin: 2.3 g/dL — ABNORMAL LOW (ref 3.5–5.2)
BUN: 5 mg/dL — ABNORMAL LOW (ref 6–23)
Calcium: 8.4 mg/dL (ref 8.4–10.5)
Creatinine, Ser: 0.65 mg/dL (ref 0.50–1.35)
Total Bilirubin: 2.4 mg/dL — ABNORMAL HIGH (ref 0.3–1.2)
Total Protein: 7.5 g/dL (ref 6.0–8.3)

## 2013-02-07 LAB — SAMPLE TO BLOOD BANK

## 2013-02-07 MED ORDER — SODIUM CHLORIDE 0.9 % IV BOLUS (SEPSIS)
1000.0000 mL | Freq: Once | INTRAVENOUS | Status: AC
  Administered 2013-02-07: 1000 mL via INTRAVENOUS

## 2013-02-07 MED ORDER — DEXTROSE-NACL 5-0.45 % IV SOLN
INTRAVENOUS | Status: AC
  Administered 2013-02-07: 500 mL via INTRAVENOUS

## 2013-02-07 NOTE — ED Provider Notes (Signed)
CSN: [id number]     Arrival date & time 02/07/13  1630 History     First MD Initiated Contact with Patient 02/07/13 1714     Chief Complaint  Patient presents with  . Hypotension   (Consider location/radiation/quality/duration/timing/severity/associated sxs/prior Treatment) HPI Comments: Pt w/ hx of CVA/TIA, prostate CA, HTN now w/ hypotension. Family states 3 wks of global decline. Not eating or drinking 2/2 poor appetite, 3 days of loose yellow stool. Weakness on exertion. Denies HA, blurred vision, diplopia, chest pain, dyspnea, n/v or abd pain. No changes in UOP. Seen by home health nurse today and noted to be febrile at 100.5 and hypotensive. Seen by his PCP today and noted BP in the 90s/40s and recommend come to ED for IVF - likely dehydration. On arrival normotensive, denies any complaints.  Patient is a 78 y.o. male presenting with general illness. The history is provided by the patient. No language interpreter was used.  Illness Location:  General Quality:  Weakness, hypotension, dehydration Severity:  Mild Onset quality:  Gradual Timing:  Constant Progression:  Worsening Chronicity:  New Associated symptoms: no abdominal pain, no chest pain, no congestion, no cough, no diarrhea, no fever, no headaches, no nausea, no rash, no shortness of breath, no sore throat and no vomiting     Past Medical History  Diagnosis Date  . CVA (cerebral infarction)   . TIA (transient ischemic attack)   . Prostate cancer   . Hypertension   . Prostate cancer   . Stroke    History reviewed. No pertinent past surgical history. No family history on file. History  Substance Use Topics  . Smoking status: Former Games developer  . Smokeless tobacco: Not on file  . Alcohol Use: No    Review of Systems  Constitutional: Negative for fever and chills.  HENT: Negative for congestion and sore throat.   Respiratory: Negative for cough and shortness of breath.   Cardiovascular: Negative for chest pain and  leg swelling.  Gastrointestinal: Negative for nausea, vomiting, abdominal pain, diarrhea and constipation.  Genitourinary: Negative for dysuria and frequency.  Skin: Negative for color change and rash.  Neurological: Positive for weakness. Negative for dizziness and headaches.  Psychiatric/Behavioral: Negative for confusion and agitation.  All other systems reviewed and are negative.    Allergies  Penicillins  Home Medications   Current Outpatient Rx  Name  Route  Sig  Dispense  Refill  . Cholecalciferol (VITAMIN D PO)   Oral   Take 1 tablet by mouth daily.         . Garlic TABS   Oral   Take 1 tablet by mouth daily.         . Multiple Vitamins-Minerals (CENTRUM SILVER ADULT 50+ PO)   Oral   Take 1 tablet by mouth at bedtime.           BP 104/50  Pulse 97  Temp(Src) 98.2 F (36.8 C) (Oral)  Resp 18  SpO2 98% Physical Exam  Constitutional: He is oriented to person, place, and time. He appears well-developed and well-nourished. No distress.  HENT:  Head: Normocephalic and atraumatic.  Mouth/Throat: Mucous membranes are dry.  Eyes: EOM are normal. Pupils are equal, round, and reactive to light.  Neck: Normal range of motion. Neck supple.  Cardiovascular: Normal rate, regular rhythm and normal heart sounds.   Pulmonary/Chest: Effort normal and breath sounds normal. No respiratory distress.  Abdominal: Soft. He exhibits no distension. There is no tenderness.  Musculoskeletal: Normal range  of motion. He exhibits no edema.       Right lower leg: He exhibits edema.       Left lower leg: He exhibits edema.  Neurological: He is alert and oriented to person, place, and time. He has normal strength. No cranial nerve deficit or sensory deficit. Coordination normal. GCS eye subscore is 4. GCS verbal subscore is 5. GCS motor subscore is 6.  Skin: Skin is warm and dry.  Psychiatric: He has a normal mood and affect. His behavior is normal.    ED Course   Procedures  (including critical care time)  Results for orders placed during the hospital encounter of 02/07/13  CBC WITH DIFFERENTIAL      Result Value Range   WBC 6.3  4.0 - 10.5 K/uL   RBC 3.24 (*) 4.22 - 5.81 MIL/uL   Hemoglobin 11.2 (*) 13.0 - 17.0 g/dL   HCT 16.1 (*) 09.6 - 04.5 %   MCV 96.9  78.0 - 100.0 fL   MCH 34.6 (*) 26.0 - 34.0 pg   MCHC 35.7  30.0 - 36.0 g/dL   RDW 40.9  81.1 - 91.4 %   Platelets 112 (*) 150 - 400 K/uL   Neutrophils Relative % 64  43 - 77 %   Neutro Abs 4.1  1.7 - 7.7 K/uL   Lymphocytes Relative 23  12 - 46 %   Lymphs Abs 1.5  0.7 - 4.0 K/uL   Monocytes Relative 12  3 - 12 %   Monocytes Absolute 0.7  0.1 - 1.0 K/uL   Eosinophils Relative 1  0 - 5 %   Eosinophils Absolute 0.1  0.0 - 0.7 K/uL   Basophils Relative 1  0 - 1 %   Basophils Absolute 0.0  0.0 - 0.1 K/uL  COMPREHENSIVE METABOLIC PANEL      Result Value Range   Sodium 135  135 - 145 mEq/L   Potassium 3.2 (*) 3.5 - 5.1 mEq/L   Chloride 97  96 - 112 mEq/L   CO2 25  19 - 32 mEq/L   Glucose, Bld 86  70 - 99 mg/dL   BUN 5 (*) 6 - 23 mg/dL   Creatinine, Ser 7.82  0.50 - 1.35 mg/dL   Calcium 8.4  8.4 - 95.6 mg/dL   Total Protein 7.5  6.0 - 8.3 g/dL   Albumin 2.3 (*) 3.5 - 5.2 g/dL   AST 213 (*) 0 - 37 U/L   ALT 32  0 - 53 U/L   Alkaline Phosphatase 115  39 - 117 U/L   Total Bilirubin 2.4 (*) 0.3 - 1.2 mg/dL   GFR calc non Af Amer >90  >90 mL/min   GFR calc Af Amer >90  >90 mL/min  TROPONIN I      Result Value Range   Troponin I <0.30  <0.30 ng/mL  URINALYSIS, ROUTINE W REFLEX MICROSCOPIC      Result Value Range   Color, Urine AMBER (*) YELLOW   APPearance HAZY (*) CLEAR   Specific Gravity, Urine 1.011  1.005 - 1.030   pH 6.5  5.0 - 8.0   Glucose, UA NEGATIVE  NEGATIVE mg/dL   Hgb urine dipstick NEGATIVE  NEGATIVE   Bilirubin Urine MODERATE (*) NEGATIVE   Ketones, ur 15 (*) NEGATIVE mg/dL   Protein, ur NEGATIVE  NEGATIVE mg/dL   Urobilinogen, UA 4.0 (*) 0.0 - 1.0 mg/dL   Nitrite NEGATIVE   NEGATIVE   Leukocytes, UA NEGATIVE  NEGATIVE  CG4 I-STAT (LACTIC ACID)      Result Value Range   Lactic Acid, Venous 3.72 (*) 0.5 - 2.2 mmol/L  CG4 I-STAT (LACTIC ACID)      Result Value Range   Lactic Acid, Venous 1.68  0.5 - 2.2 mmol/L  SAMPLE TO BLOOD BANK      Result Value Range   Blood Bank Specimen SAMPLE AVAILABLE FOR TESTING     Sample Expiration 02/08/2013     DG Chest 2 View (Final result)  Result time: 02/07/13 19:12:10    Final result by Rad Results In Interface (02/07/13 19:12:10)    Narrative:   *RADIOLOGY REPORT*  Clinical Data: Hypotension, former smoker  CHEST - 2 VIEW  Comparison: 11/02/2012; 05/20/2010  Findings:  Grossly unchanged cardiac silhouette and mediastinal contours with suspected ectasia of the thoracic aorta. The lungs appear hyperexpanded with flattening of the bilateral hemidiaphragms and mild diffuse thickening of the pulmonary interstitium. No development of a trace left-sided pleural effusion with associated left basilar heterogeneous opacities. No evidence of edema. Unchanged bones.  IMPRESSION: 1. Small left-sided effusion with associated left basilar opacities, atelectasis versus infiltrate. 2. Suspected at least mild ectasia of the thoracic aorta, grossly unchanged.   Original Report Authenticated By: Tacey Ruiz, MD              EKG Results    Date: 02/07/2013  Rate: 82  Rhythm: normal sinus rhythm  QRS Axis: normal  Intervals: normal  ST/T Wave abnormalities: nonspecific ST/T changes  Conduction Disutrbances:none  Narrative Interpretation:   Old EKG Reviewed: unchanged   No results found. No diagnosis found.  MDM  Exam as above, no focal neuro deficit, no hypoxia or resp distress, normotensive, afebrile, mildly orthostatic, given 1 L IVF, initial lactic acid 3.72 - repeat lactate after IVF cleared, u/a neg for infection, CBC/CMP unremarkable - mild elevation in T bili, no anemia, CXR - NACPF, ECG w/out  acute ischemia. D/w Dr Algie Coffer recommend to give additional 500cc IVF and and ok to d/c home. Will follow up in his clinic. Pt feels well. Denies complaints. BP stable. At this time doubt sepsis. Stable for d/c home, given return precautions and follow up instructions.   I have personally reviewed labs and imaging and considered in my MDM. Case d/w Dr Judd Lien   1. Lactic acidosis   2. Dehydration   3. Hypotension    Discharge Medication List as of 02/07/2013  9:52 PM     Devin Rodriguez, MD [address] [place] [address] [phone]  Schedule an appointment as soon as possible for a visit      Audelia Hives, MD 02/07/13 [phone]

## 2013-02-07 NOTE — ED Notes (Signed)
The pt is not eating weakness  Hypotension for 3 weeks intermittently .  He has also has some bowel  Problems.  The pt is alert daughter with the pt

## 2013-02-08 NOTE — ED Provider Notes (Signed)
I saw and evaluated the patient, reviewed the resident's note and I agree with the findings and plan. The patient presents with complaints of low blood pressure, weakness, decreased apetite, and generalized malaise for the past three weeks.  He was seen by Dr. Algie Coffer this afternoon and was sent here for evaluation of low blood pressure.  He denies any chest pain, shortness of breath, or other specific complaints.    On exam, the patient appears well and is in no distress.  The vitals are stable and he is afebrile.  He was borderline hypotensive.  The heart is regular rate and rhythm and the lungs are clear.  The abdomen is benign and he appears well-hydrated.  There is no edema.  Neurologically, the cranial nerves are intact and the strength is symmetrical and 5/5 in all extremities.    The workup reveals cbc, electrolytes, and unchanged ekg (I agree with the resident's interpretation.)  The lactate was initially elevated and he was hydrated with normal saline.  The lactate was repeated after hydration and was noted to have improved.  The case discussed with Dr. Algie Coffer who feels as though the patient can be discharged and he will see him in the office in the next few days.    Geoffery Lyons, MD 02/08/13 [phone]

## 2014-03-18 DIAGNOSIS — I619 Nontraumatic intracerebral hemorrhage, unspecified: Secondary | ICD-10-CM

## 2014-03-18 HISTORY — DX: Nontraumatic intracerebral hemorrhage, unspecified: I61.9

## 2014-03-28 ENCOUNTER — Encounter (HOSPITAL_COMMUNITY): Payer: Self-pay | Admitting: Emergency Medicine

## 2014-03-28 ENCOUNTER — Emergency Department (HOSPITAL_COMMUNITY): Payer: Medicare HMO

## 2014-03-28 ENCOUNTER — Observation Stay (HOSPITAL_COMMUNITY)
Admission: EM | Admit: 2014-03-28 | Discharge: 2014-03-29 | Disposition: A | Discharge status: Hospice | Payer: Medicare HMO | Attending: Internal Medicine | Admitting: Internal Medicine

## 2014-03-28 DIAGNOSIS — I10 Essential (primary) hypertension: Secondary | ICD-10-CM | POA: Diagnosis not present

## 2014-03-28 DIAGNOSIS — Z87891 Personal history of nicotine dependence: Secondary | ICD-10-CM | POA: Diagnosis not present

## 2014-03-28 DIAGNOSIS — H05239 Hemorrhage of unspecified orbit: Secondary | ICD-10-CM

## 2014-03-28 DIAGNOSIS — S06330A Contusion and laceration of cerebrum, unspecified, without loss of consciousness, initial encounter: Secondary | ICD-10-CM | POA: Diagnosis not present

## 2014-03-28 DIAGNOSIS — S0510XA Contusion of eyeball and orbital tissues, unspecified eye, initial encounter: Secondary | ICD-10-CM | POA: Diagnosis not present

## 2014-03-28 DIAGNOSIS — Z8673 Personal history of transient ischemic attack (TIA), and cerebral infarction without residual deficits: Secondary | ICD-10-CM | POA: Insufficient documentation

## 2014-03-28 DIAGNOSIS — D61818 Other pancytopenia: Secondary | ICD-10-CM

## 2014-03-28 DIAGNOSIS — T148XXA Other injury of unspecified body region, initial encounter: Secondary | ICD-10-CM | POA: Diagnosis present

## 2014-03-28 DIAGNOSIS — W1809XA Striking against other object with subsequent fall, initial encounter: Secondary | ICD-10-CM | POA: Diagnosis not present

## 2014-03-28 DIAGNOSIS — Z79899 Other long term (current) drug therapy: Secondary | ICD-10-CM | POA: Diagnosis not present

## 2014-03-28 DIAGNOSIS — Z8546 Personal history of malignant neoplasm of prostate: Secondary | ICD-10-CM | POA: Insufficient documentation

## 2014-03-28 DIAGNOSIS — H05231 Hemorrhage of right orbit: Secondary | ICD-10-CM | POA: Diagnosis present

## 2014-03-28 DIAGNOSIS — W19XXXA Unspecified fall, initial encounter: Secondary | ICD-10-CM | POA: Diagnosis present

## 2014-03-28 LAB — BASIC METABOLIC PANEL
Anion gap: 13 (ref 5–15)
BUN: 11 mg/dL (ref 6–23)
CHLORIDE: 100 meq/L (ref 96–112)
CO2: 22 meq/L (ref 19–32)
CREATININE: 0.7 mg/dL (ref 0.50–1.35)
Calcium: 8.9 mg/dL (ref 8.4–10.5)
GFR calc Af Amer: >90 mL/min (ref >90)
GFR calc non Af Amer: 89 mL/min — ABNORMAL LOW (ref >90)
GLUCOSE: 93 mg/dL (ref 70–99)
Potassium: 4.3 mEq/L (ref 3.7–5.3)
Sodium: 135 mEq/L — ABNORMAL LOW (ref 137–147)

## 2014-03-28 LAB — CBC WITH DIFFERENTIAL/PLATELET
BASOS PCT: 1 % (ref 0–1)
Basophils Absolute: 0 10*3/uL (ref 0.0–0.1)
EOS PCT: 2 % (ref 0–5)
Eosinophils Absolute: 0.1 10*3/uL (ref 0.0–0.7)
HEMATOCRIT: 21.6 % — AB (ref 39.0–52.0)
HEMOGLOBIN: 7.8 g/dL — AB (ref 13.0–17.0)
LYMPHS ABS: 1 10*3/uL (ref 0.7–4.0)
Lymphocytes Relative: 31 % (ref 12–46)
MCH: 37.3 pg — ABNORMAL HIGH (ref 26.0–34.0)
MCHC: 36.1 g/dL — ABNORMAL HIGH (ref 30.0–36.0)
MCV: 103.3 fL — AB (ref 78.0–100.0)
MONOS PCT: 18 % — AB (ref 3–12)
Monocytes Absolute: 0.6 10*3/uL (ref 0.1–1.0)
NEUTROS ABS: 1.6 10*3/uL — AB (ref 1.7–7.7)
Neutrophils Relative %: 48 % (ref 43–77)
Platelets: 63 10*3/uL — ABNORMAL LOW (ref 150–400)
RBC: 2.09 MIL/uL — ABNORMAL LOW (ref 4.22–5.81)
RDW: 23.4 % — AB (ref 11.5–15.5)
WBC: 3.3 10*3/uL — AB (ref 4.0–10.5)

## 2014-03-28 LAB — RETICULOCYTES
RBC.: 2.25 MIL/uL — ABNORMAL LOW (ref 4.22–5.81)
RETIC CT PCT: 2.8 % (ref 0.4–3.1)
Retic Count, Absolute: 63 10*3/uL (ref 19.0–186.0)

## 2014-03-28 LAB — SAVE SMEAR

## 2014-03-28 MED ORDER — INFLUENZA VAC SPLIT QUAD 0.5 ML IM SUSY
0.5000 mL | PREFILLED_SYRINGE | INTRAMUSCULAR | Status: AC
  Administered 2014-03-29: 0.5 mL via INTRAMUSCULAR
  Filled 2014-03-28: qty 0.5

## 2014-03-28 MED ORDER — ADULT MULTIVITAMIN W/MINERALS CH
1.0000 | ORAL_TABLET | Freq: Every day | ORAL | Status: DC
  Administered 2014-03-29: 1 via ORAL
  Filled 2014-03-28: qty 1

## 2014-03-28 MED ORDER — PROPARACAINE HCL 0.5 % OP SOLN
1.0000 [drp] | Freq: Once | OPHTHALMIC | Status: AC
  Administered 2014-03-28: 1 [drp] via OPHTHALMIC
  Filled 2014-03-28: qty 15

## 2014-03-28 MED ORDER — VITAMIN D3 25 MCG (1000 UNIT) PO TABS
1000.0000 [IU] | ORAL_TABLET | Freq: Every day | ORAL | Status: DC
  Administered 2014-03-28 – 2014-03-29 (×2): 1000 [IU] via ORAL
  Filled 2014-03-28 (×2): qty 1

## 2014-03-28 MED ORDER — FLUORESCEIN SODIUM 1 MG OP STRP
1.0000 | ORAL_STRIP | Freq: Once | OPHTHALMIC | Status: AC
  Administered 2014-03-28: 1 via OPHTHALMIC
  Filled 2014-03-28: qty 1

## 2014-03-28 MED ORDER — CYANOCOBALAMIN 500 MCG PO TABS
500.0000 ug | ORAL_TABLET | Freq: Every day | ORAL | Status: DC
  Administered 2014-03-29: 500 ug via ORAL
  Filled 2014-03-28: qty 1

## 2014-03-28 MED ORDER — SODIUM CHLORIDE 0.9 % IV SOLN
INTRAVENOUS | Status: DC
  Administered 2014-03-28: 20:00:00 via INTRAVENOUS

## 2014-03-28 MED ORDER — OMEGA-3-ACID ETHYL ESTERS 1 G PO CAPS
2.0000 g | ORAL_CAPSULE | Freq: Every day | ORAL | Status: DC
  Administered 2014-03-28 – 2014-03-29 (×2): 2 g via ORAL
  Filled 2014-03-28 (×2): qty 2

## 2014-03-28 NOTE — H&P (Signed)
Hospitalist Admission History and Physical  Patient name: Devin Parker Medical record number: [id number] Date of birth: 17-Jun-1935 Age: 79 y.o. Gender: male  Primary Care Provider: [name], MD  Chief Complaint: periorbital hematoma, mechanical fall, septum pellucidum hematoma   History of Present Illness:This is a 79 y.o. year old male with significant past medical history of HTN, CVA, prostate CA presenting with periorbital hematoma, septum pellucidum hematoma s/p mechanical fall. Pt lives at home alone. Usually ambulates with a cane. States that he slipped yesterday, hitting his R eye/orbital area on cane in process of falling. Denies any CP, SOB, dizziness, presyncopal sxs prior to falling. Denies any HA, LOV, eye pain since incident. Was stressed by family to come to ER for further evaluation. Pt does not take blood thinners, ASA or any other prescription medication.  On presentation, hemodynamically stable. Afebrile. BPs 852D-[id number] systolic. Satting > 94% on RA. Hgb noted at 7.8 (baseline around 10), plt 63. Cr WNL @ 0.7. Head and maxiofacial CT  Posttraumatic hematoma of the septum pellucidum, Considerable soft tissue hematoma over right orbit. Neurosurgery was consulted with recommendation for overnight observation and repeat imaging tomorrow. Eye function has remained intact. No orbital fracture on imaging. ENT c/s deferred in ER given otherwise normal ophtho exam.       Assessment and Plan: Devin Parker is a 79 y.o. year old male presenting with periorbital hematoma of R eye, septum pellucidum hematoma, mechanical fall, anemia   Active Problems:   Periorbital hematoma of right eye   mechanical fall    septum pellucidum hematoma    1- Periorbital hematoma/septum pellucidum hematoma -Will observe overnight  -appreciate NS recs -currently no clinical indication for ophto/ENT evaluation of sxs  -low threshold for consult if sxs worsen  -hold  anticoagulation -repeat imaging in am   2-Mechanical fall  -Pt self reports a fairly recurrent issue at home  - [facility] consult PT/OT for recs  -no focal neuro deficits on exam today concerning for CVA given history, though low threshold for MRI imaging if ambulation status worsens.   3-HTN/CVA -currently not on medications -BP mildly elevated at baseline  -?may benefit from ASA +/- ACE upon resolution of above -Continue to follow   4-Anemia  -hgb 7.8 today-baseline around 10  -may be element of ABLA  -noted hematoma findings on imaging -monitor  -check anemia panel-macrocytic predominance on MCV  -also trend plts-peripheral smear if persistently low.  FEN/GI: heart healthy diet.  Prophylaxis: sub q heparin  Disposition: pending further evaluation  Code Status:Full Code    Patient Active Problem List   Diagnosis Date Noted  . Periorbital hematoma of right eye 03/28/2014   Past Medical History: Past Medical History  Diagnosis Date  . CVA (cerebral infarction)   . TIA (transient ischemic attack)   . Prostate cancer   . Hypertension   . Prostate cancer   . Stroke     Past Surgical History: History reviewed. No pertinent past surgical history.  Social History: History   Social History  . Marital Status: Married    Spouse Name: N/A    Number of Children: N/A  . Years of Education: N/A   Social History Main Topics  . Smoking status: Former Research scientist (life sciences)  . Smokeless tobacco: None  . Alcohol Use: No  . Drug Use: No  . Sexual Activity: None   Other Topics Concern  . None   Social History Narrative  . None    Family History: History reviewed. No  pertinent family history.  Allergies: Allergies  Allergen Reactions  . Penicillins Rash    Current Facility-Administered Medications  Medication Dose Route Frequency Provider Last Rate Last Dose  . 0.9 %  sodium chloride infusion   Intravenous Continuous Shanda Howells, MD      . cholecalciferol (VITAMIN D) tablet  1,000 Units  1,000 Units Oral Daily Shanda Howells, MD      . cyanocobalamin tablet 500 mcg  500 mcg Oral Daily Shanda Howells, MD      . multivitamin with minerals tablet 1 tablet  1 tablet Oral Daily Shanda Howells, MD      . omega-3 acid ethyl esters (LOVAZA) capsule 2 g  2 g Oral Daily Shanda Howells, MD       Current Outpatient Prescriptions  Medication Sig Dispense Refill  . cholecalciferol (VITAMIN D) 1000 UNITS tablet Take 1,000 Units by mouth daily.      . cyanocobalamin 500 MCG tablet Take 500 mcg by mouth daily. Vitamin B12      . Garlic 063 MG TABS Take 200 mg by mouth 2 (two) times a week.      . Multiple Vitamin (MULTIVITAMIN WITH MINERALS) TABS tablet Take 1 tablet by mouth daily. Centrum Silver over 50      . Omega-3 Fatty Acids (FISH OIL) 500 MG CAPS Take 500 mg by mouth 2 (two) times a week.       Review Of Systems: 12 point ROS negative except as noted above in HPI.  Physical Exam: Filed Vitals:   03/28/14 1815  BP: 158/71  Pulse: 78  Temp:   Resp: 16    General: alert and cooperative HEENT: PERRLA, extra ocular movement intact, sclera clear, anicteric and R periorbital swelling and bruising. minimal tenderness Heart: S1, S2 normal, no murmur, rub or gallop, regular rate and rhythm Lungs: clear to auscultation, no wheezes or rales and unlabored breathing Abdomen: abdomen is soft without significant tenderness, masses, organomegaly or guarding Extremities: extremities normal, atraumatic, no cyanosis or edema Skin:no rashes, no ecchymoses Neurology: normal without focal findings  Labs and Imaging: Lab Results  Component Value Date/Time   NA 135* 03/28/2014  4:54 PM   K 4.3 03/28/2014  4:54 PM   CL 100 03/28/2014  4:54 PM   CO2 22 03/28/2014  4:54 PM   BUN 11 03/28/2014  4:54 PM   CREATININE 0.70 03/28/2014  4:54 PM   GLUCOSE 93 03/28/2014  4:54 PM   Lab Results  Component Value Date   WBC 3.3* 03/28/2014   HGB 7.8* 03/28/2014   HCT 21.6* 03/28/2014   MCV 103.3*  03/28/2014   PLT 63* 03/28/2014    Ct Head Wo Contrast  03/28/2014   CLINICAL DATA:  Fall yesterday with orbital hematoma  EXAM: CT HEAD WITHOUT CONTRAST  CT MAXILLOFACIAL WITHOUT CONTRAST  TECHNIQUE: Multidetector CT imaging of the head and maxillofacial structures were performed using the standard protocol without intravenous contrast. Multiplanar CT image reconstructions of the maxillofacial structures were also generated.  COMPARISON:  03/26/2010.  FINDINGS: CT HEAD FINDINGS  The bony calvarium is intact. Significant subcutaneous hematoma is noted about the right orbit consistent with the patient's recent history. Diffuse atrophic changes are seen. There is a rounded 11.6 mm hyperdensity which arises from the septum pellucidum extending into the right lateral ventricle. The patient's recent trauma and that the lesion was not present on prior exams dating back to 2011, this likely represents a posttraumatic hematoma. Followup imaging is recommended. No other focal  hemorrhage, infarct or space-occupying mass lesion is noted. Chronic white matter ischemic changes are again noted.  CT MAXILLOFACIAL FINDINGS  Considerable subcutaneous hematoma is noted overlying the right orbit consistent with a recent injury. The underlying bony structures show no evidence of acute fracture. The orbits and their contents are within normal limits. No other soft tissue abnormality is seen. No changes to suggest blowout fracture are noted.  IMPRESSION: CT of the head: Posttraumatic hematoma of the septum pellucidum as described. Short-term followup to assess for resolution is recommended. No other acute abnormality is noted.  CT of the maxillofacial bones: No acute bony abnormality is noted. Considerable soft tissue hematoma is noted over the right orbit consistent with the recent history.   Electronically Signed   By: Inez Catalina M.D.   On: 03/28/2014 15:33   Ct Maxillofacial Wo Cm  03/28/2014   CLINICAL DATA:  Fall yesterday  with orbital hematoma  EXAM: CT HEAD WITHOUT CONTRAST  CT MAXILLOFACIAL WITHOUT CONTRAST  TECHNIQUE: Multidetector CT imaging of the head and maxillofacial structures were performed using the standard protocol without intravenous contrast. Multiplanar CT image reconstructions of the maxillofacial structures were also generated.  COMPARISON:  03/26/2010.  FINDINGS: CT HEAD FINDINGS  The bony calvarium is intact. Significant subcutaneous hematoma is noted about the right orbit consistent with the patient's recent history. Diffuse atrophic changes are seen. There is a rounded 11.6 mm hyperdensity which arises from the septum pellucidum extending into the right lateral ventricle. The patient's recent trauma and that the lesion was not present on prior exams dating back to 2011, this likely represents a posttraumatic hematoma. Followup imaging is recommended. No other focal hemorrhage, infarct or space-occupying mass lesion is noted. Chronic white matter ischemic changes are again noted.  CT MAXILLOFACIAL FINDINGS  Considerable subcutaneous hematoma is noted overlying the right orbit consistent with a recent injury. The underlying bony structures show no evidence of acute fracture. The orbits and their contents are within normal limits. No other soft tissue abnormality is seen. No changes to suggest blowout fracture are noted.  IMPRESSION: CT of the head: Posttraumatic hematoma of the septum pellucidum as described. Short-term followup to assess for resolution is recommended. No other acute abnormality is noted.  CT of the maxillofacial bones: No acute bony abnormality is noted. Considerable soft tissue hematoma is noted over the right orbit consistent with the recent history.   Electronically Signed   By: Inez Catalina M.D.   On: 03/28/2014 15:33           Shanda Howells MD  Pager: [phone]

## 2014-03-28 NOTE — ED Provider Notes (Signed)
CSN: [id number]     Arrival date & time 03/28/14  1359 History   First MD Initiated Contact with Patient 03/28/14 1418     No chief complaint on file.    (Consider location/radiation/quality/duration/timing/severity/associated sxs/prior Treatment) HPI Solmon Martez Weiand is a 79 y.o. male he was here for evaluation of an eye injury after falling yesterday. Family is with patient and they assist in the course of the history of present illness. Patient states yesterday around 12:00 in the afternoon he was using his cane to try to sit down in his chair he got his feet caught up in his cane and fell to his carpeted floor. He denies any loss of consciousness or hitting his head on the ground. His family states they believe he hit his eye on his cane as he was falling. They report the swelling around his eyes twice as large today as it was yesterday. Patient denies any fevers, headache, visual disturbances. Patient denies any pain or discomfort at this time. He denies being on any anticoagulation.  Past Medical History  Diagnosis Date  . CVA (cerebral infarction)   . TIA (transient ischemic attack)   . Prostate cancer   . Hypertension   . Prostate cancer   . Stroke    History reviewed. No pertinent past surgical history. History reviewed. No pertinent family history. History  Substance Use Topics  . Smoking status: Former Research scientist (life sciences)  . Smokeless tobacco: Not on file  . Alcohol Use: No    Review of Systems  Constitutional: Negative for fever.  HENT: Negative for congestion.   Eyes: Positive for pain.  Cardiovascular: Negative for chest pain.  Musculoskeletal: Negative for neck pain.      Allergies  Penicillins  Home Medications   Prior to Admission medications   Medication Sig Start Date End Date Taking? Authorizing Provider  cholecalciferol (VITAMIN D) 1000 UNITS tablet Take 1,000 Units by mouth daily.   Yes Historical Provider, MD  cyanocobalamin 500 MCG tablet Take 500 mcg by  mouth daily. Vitamin B12   Yes Historical Provider, MD  Garlic 676 MG TABS Take 200 mg by mouth 2 (two) times a week.   Yes Historical Provider, MD  Multiple Vitamin (MULTIVITAMIN WITH MINERALS) TABS tablet Take 1 tablet by mouth daily. Centrum Silver over 50   Yes Historical Provider, MD  Omega-3 Fatty Acids (FISH OIL) 500 MG CAPS Take 500 mg by mouth 2 (two) times a week.   Yes Historical Provider, MD   BP 158/82  Pulse 82  Temp(Src) 98.1 F (36.7 C) (Oral)  Resp 16  Ht 6\' 5"  (1.956 m)  Wt 190 lb (86.183 kg)  BMI 22.53 kg/m2  SpO2 97% Physical Exam  Nursing note and vitals reviewed. Constitutional:  Awake, alert, nontoxic appearance.  HENT:  Head: Atraumatic.  Eyes: Conjunctivae are normal. Pupils are equal, round, and reactive to light. Right eye exhibits no discharge. Left eye exhibits no discharge.  Horizontal extraocular movements intact. Patient has difficulty looking up bilateral. No some conjunctival hemorrhage, hyphema. Significant edema and hematoma to right orbit. No open wounds or lesions. Tenderness to palpation of right lateral orbit and zygomatic arch.  Tonometry IOP L 13, R 18 Fluoro- no corneal abrasion appreciated.  Neck: Neck supple.  Cardiovascular: Normal rate, regular rhythm and normal heart sounds.   Pulmonary/Chest: Effort normal and breath sounds normal. No respiratory distress. He exhibits no tenderness.  Abdominal: Soft. There is no tenderness. There is no rebound.  Musculoskeletal: He exhibits no  tenderness.  Baseline ROM, no obvious new focal weakness.  Neurological:  Mental status and motor strength appears baseline for patient and situation.  Skin: No rash noted.  Psychiatric: He has a normal mood and affect.    ED Course  Procedures (including critical care time) Labs Review Labs Reviewed  CBC WITH DIFFERENTIAL - Abnormal; Notable for the following:    WBC 3.3 (*)    RBC 2.09 (*)    Hemoglobin 7.8 (*)    HCT 21.6 (*)    MCV 103.3 (*)     MCH 37.3 (*)    MCHC 36.1 (*)    RDW 23.4 (*)    All other components within normal limits  BASIC METABOLIC PANEL - Abnormal; Notable for the following:    Sodium 135 (*)    GFR calc non Af Amer 89 (*)    All other components within normal limits    Imaging Review Ct Head Wo Contrast  03/28/2014   CLINICAL DATA:  Fall yesterday with orbital hematoma  EXAM: CT HEAD WITHOUT CONTRAST  CT MAXILLOFACIAL WITHOUT CONTRAST  TECHNIQUE: Multidetector CT imaging of the head and maxillofacial structures were performed using the standard protocol without intravenous contrast. Multiplanar CT image reconstructions of the maxillofacial structures were also generated.  COMPARISON:  03/26/2010.  FINDINGS: CT HEAD FINDINGS  The bony calvarium is intact. Significant subcutaneous hematoma is noted about the right orbit consistent with the patient's recent history. Diffuse atrophic changes are seen. There is a rounded 11.6 mm hyperdensity which arises from the septum pellucidum extending into the right lateral ventricle. The patient's recent trauma and that the lesion was not present on prior exams dating back to 2011, this likely represents a posttraumatic hematoma. Followup imaging is recommended. No other focal hemorrhage, infarct or space-occupying mass lesion is noted. Chronic white matter ischemic changes are again noted.  CT MAXILLOFACIAL FINDINGS  Considerable subcutaneous hematoma is noted overlying the right orbit consistent with a recent injury. The underlying bony structures show no evidence of acute fracture. The orbits and their contents are within normal limits. No other soft tissue abnormality is seen. No changes to suggest blowout fracture are noted.  IMPRESSION: CT of the head: Posttraumatic hematoma of the septum pellucidum as described. Short-term followup to assess for resolution is recommended. No other acute abnormality is noted.  CT of the maxillofacial bones: No acute bony abnormality is noted.  Considerable soft tissue hematoma is noted over the right orbit consistent with the recent history.   Electronically Signed   By: Inez Catalina M.D.   On: 03/28/2014 15:33   Ct Maxillofacial Wo Cm  03/28/2014   CLINICAL DATA:  Fall yesterday with orbital hematoma  EXAM: CT HEAD WITHOUT CONTRAST  CT MAXILLOFACIAL WITHOUT CONTRAST  TECHNIQUE: Multidetector CT imaging of the head and maxillofacial structures were performed using the standard protocol without intravenous contrast. Multiplanar CT image reconstructions of the maxillofacial structures were also generated.  COMPARISON:  03/26/2010.  FINDINGS: CT HEAD FINDINGS  The bony calvarium is intact. Significant subcutaneous hematoma is noted about the right orbit consistent with the patient's recent history. Diffuse atrophic changes are seen. There is a rounded 11.6 mm hyperdensity which arises from the septum pellucidum extending into the right lateral ventricle. The patient's recent trauma and that the lesion was not present on prior exams dating back to 2011, this likely represents a posttraumatic hematoma. Followup imaging is recommended. No other focal hemorrhage, infarct or space-occupying mass lesion is noted. Chronic white matter  ischemic changes are again noted.  CT MAXILLOFACIAL FINDINGS  Considerable subcutaneous hematoma is noted overlying the right orbit consistent with a recent injury. The underlying bony structures show no evidence of acute fracture. The orbits and their contents are within normal limits. No other soft tissue abnormality is seen. No changes to suggest blowout fracture are noted.  IMPRESSION: CT of the head: Posttraumatic hematoma of the septum pellucidum as described. Short-term followup to assess for resolution is recommended. No other acute abnormality is noted.  CT of the maxillofacial bones: No acute bony abnormality is noted. Considerable soft tissue hematoma is noted over the right orbit consistent with the recent history.    Electronically Signed   By: Inez Catalina M.D.   On: 03/28/2014 15:33     EKG Interpretation None     Meds given in ED:  Medications  proparacaine (ALCAINE) 0.5 % ophthalmic solution 1 drop (1 drop Right Eye Given 03/28/14 1657)  fluorescein ophthalmic strip 1 strip (1 strip Right Eye Given 03/28/14 1657)    New Prescriptions   No medications on file   Filed Vitals:   03/28/14 1700 03/28/14 1715 03/28/14 1730 03/28/14 1745  BP: 152/81 149/73 161/74 158/82  Pulse: 77 79 79 82  Temp:      TempSrc:      Resp: 16 15 13 16   Height:      Weight:      SpO2: 97% 98% 97% 97%    MDM  Vitals stable - WNL -afebrile Pt resting comfortably in ED. PE Upon re examination, EOM intact. No subconjunctival hemorrhage. No hyphema Imaging shows contusion of the septum pellucidum, consult placed to Neurosurgery. No evidence of fracture or globe rupture. Neurosurgery recommends repeat CT in 24 hrs with overnight in obs. Pt admitted. Care transferred to Dr. Rexene Agent for admission Discussed admission to hospital for obs and repeat imaging, pt very amenable to plan.   Final diagnoses:  None  Prior to patient discharge, I discussed and reviewed this case with Dr.Yao         Verl Dicker, PA-C 03/28/14 1808

## 2014-03-28 NOTE — ED Notes (Signed)
Cartner,PA and Dr. Colin Rhein at bedside

## 2014-03-28 NOTE — ED Provider Notes (Signed)
Medical screening examination/treatment/procedure(s) were conducted as a shared visit with non-physician practitioner(s) and myself.  I personally evaluated the patient during the encounter.   EKG Interpretation None     Devin Parker is a 79 y.o. male here with fall. Mechanical fall yesterday, hit R eye area. Family noted increased swelling around R eye. Denies double vision or loss of vision. On exam, obvious R facial hematoma. Extra ocular movements intact. No obvious subconjunctival hemorrhage or hyphema. PA was able to get nl eye pressures and no obvious corneal abrasion. CT showed septum pellucidum hematoma. I called Dr. Ronnald Ramp from neurosurgery, who suggest repeat CT in 24 hrs and admit for observation. Will admit to medicine.     Wandra Arthurs, MD 03/28/14 2111

## 2014-03-28 NOTE — ED Notes (Signed)
Hospitalist at bedside 

## 2014-03-28 NOTE — ED Notes (Signed)
Pt presents with fall yesterday, pt reports tripping over the end of his cane, falling forward, pt unsure if his face struck floor, denies any LOC.  Wife reports orbital hematoma has doubled in size.  Pt denies any vision change.

## 2014-03-28 NOTE — Progress Notes (Signed)
Patient ID: Devin Parker Nurse, male   DOB: [birth year], 79 y.o.   MRN: [id number] I received a call from the emergency department physician to review the film on this patient.  Apparently he had a ground level fall. He is on no anticoagulants.  He is at his clinical baseline.  His head CT shows a small round hyperdensity adjacent to the septum pellucidum on the right without list, traumatic subarachnoid hemorrhage, contusion, shift or mass effect.  Was not there on previous imaging in 2011.  Given the fact that this is a new finding on a CT scan after a fairly insignificant fall, it could represent traumatic hemorrhage within the ventricle on the right.  I think it is clinically insignificant.  The likelihood of expansion even if it does represent hemorrhage is quite small.The safest measure is to simply admit him and watch him overnight and repeat his imaging in the morning.  There is some evidence that suggests that repeat imaging only changes treatment about 4% of the time.  Please let me know if I can be of further assistance.  Please call me should his CT scan show a significant change or if he has a significant change in neurologic function. please do Not put him on anticoagulants for DVT prophylaxis.

## 2014-03-28 NOTE — ED Notes (Signed)
Patient transported to CT 

## 2014-03-28 NOTE — ED Notes (Signed)
Cartner PA at bedside

## 2014-03-28 NOTE — ED Notes (Signed)
Dr. Yao at bedside. 

## 2014-03-28 NOTE — ED Notes (Signed)
Attempted to call report x 1  

## 2014-03-28 NOTE — ED Notes (Signed)
Ordered pt a dinner tray 

## 2014-03-29 ENCOUNTER — Observation Stay (HOSPITAL_COMMUNITY): Payer: Medicare HMO

## 2014-03-29 ENCOUNTER — Encounter (HOSPITAL_COMMUNITY): Payer: Self-pay | Admitting: Radiology

## 2014-03-29 DIAGNOSIS — W19XXXA Unspecified fall, initial encounter: Secondary | ICD-10-CM

## 2014-03-29 DIAGNOSIS — D61818 Other pancytopenia: Secondary | ICD-10-CM

## 2014-03-29 LAB — COMPREHENSIVE METABOLIC PANEL
ALT: 16 U/L (ref 0–53)
AST: 51 U/L — ABNORMAL HIGH (ref 0–37)
Albumin: 2.1 g/dL — ABNORMAL LOW (ref 3.5–5.2)
Alkaline Phosphatase: 87 U/L (ref 39–117)
Anion gap: 11 (ref 5–15)
BILIRUBIN TOTAL: 4.7 mg/dL — AB (ref 0.3–1.2)
BUN: 11 mg/dL (ref 6–23)
CHLORIDE: 102 meq/L (ref 96–112)
CO2: 23 mEq/L (ref 19–32)
CREATININE: 0.72 mg/dL (ref 0.50–1.35)
Calcium: 8.4 mg/dL (ref 8.4–10.5)
GFR calc Af Amer: >90 mL/min (ref >90)
GFR calc non Af Amer: 88 mL/min — ABNORMAL LOW (ref >90)
Glucose, Bld: 85 mg/dL (ref 70–99)
Potassium: 3.8 mEq/L (ref 3.7–5.3)
Sodium: 136 mEq/L — ABNORMAL LOW (ref 137–147)
Total Protein: 7 g/dL (ref 6.0–8.3)

## 2014-03-29 LAB — CBC WITH DIFFERENTIAL/PLATELET
BASOS ABS: 0 10*3/uL (ref 0.0–0.1)
BASOS PCT: 1 % (ref 0–1)
EOS PCT: 5 % (ref 0–5)
Eosinophils Absolute: 0.1 10*3/uL (ref 0.0–0.7)
HCT: 21 % — ABNORMAL LOW (ref 39.0–52.0)
Hemoglobin: 7.4 g/dL — ABNORMAL LOW (ref 13.0–17.0)
Lymphocytes Relative: 29 % (ref 12–46)
Lymphs Abs: 0.8 10*3/uL (ref 0.7–4.0)
MCH: 34.7 pg — ABNORMAL HIGH (ref 26.0–34.0)
MCHC: 35.2 g/dL (ref 30.0–36.0)
MCV: 98.6 fL (ref 78.0–100.0)
Monocytes Absolute: 0.5 10*3/uL (ref 0.1–1.0)
Monocytes Relative: 20 % — ABNORMAL HIGH (ref 3–12)
NEUTROS ABS: 1.2 10*3/uL — AB (ref 1.7–7.7)
Neutrophils Relative %: 45 % (ref 43–77)
Platelets: 57 10*3/uL — ABNORMAL LOW (ref 150–400)
RBC: 2.13 MIL/uL — ABNORMAL LOW (ref 4.22–5.81)
RDW: 20.6 % — AB (ref 11.5–15.5)
WBC: 2.7 10*3/uL — AB (ref 4.0–10.5)

## 2014-03-29 LAB — FERRITIN: Ferritin: 68 ng/mL (ref 22–322)

## 2014-03-29 LAB — IRON AND TIBC
Iron: 105 ug/dL (ref 42–135)
SATURATION RATIOS: 42 % (ref 20–55)
TIBC: 250 ug/dL (ref 215–435)
UIBC: 145 ug/dL (ref 125–400)

## 2014-03-29 LAB — VITAMIN B12: Vitamin B-12: >2000 pg/mL — ABNORMAL HIGH (ref 211–911)

## 2014-03-29 LAB — FOLATE: Folate: 4 ng/mL

## 2014-03-29 NOTE — Discharge Summary (Signed)
Physician Discharge Summary  Devin Parker HCW:[id number] DOB: 12/03/1934 DOA: 03/28/2014  PCP: [name], MD  Admit date: 03/28/2014 Discharge date: 03/29/2014  Time spent: 35 minutes  Recommendations for Outpatient Follow-up:  1. Home hospice with 24 hour care by family 2. Outpatient pancytopenia work up per PCP 3. Cbc, bmp 1 week  Discharge Diagnoses:  Active Problems:   Periorbital hematoma of right eye   mechanical fall    septum pellucidum hematoma    Discharge Condition: improved  Diet recommendation: regular  Filed Weights   03/28/14 1411  Weight: 86.183 kg (190 lb)    History of present illness:  This is a 79 y.o. year old male with significant past medical history of HTN, CVA, prostate CA presenting with periorbital hematoma, septum pellucidum hematoma s/p mechanical fall. Pt lives at home alone. Usually ambulates with a cane. States that he slipped yesterday, hitting his R eye/orbital area on cane in process of falling. Denies any CP, SOB, dizziness, presyncopal sxs prior to falling. Denies any HA, LOV, eye pain since incident. Was stressed by family to come to ER for further evaluation. Pt does not take blood thinners, ASA or any other prescription medication.  On presentation, hemodynamically stable. Afebrile. BPs 176H-607P systolic. Satting > 94% on RA. Hgb noted at 7.8 (baseline around 10), plt 63. Cr WNL @ 0.7. Head and maxiofacial CT Posttraumatic hematoma of the septum pellucidum, Considerable soft tissue hematoma over right orbit. Neurosurgery was consulted with recommendation for overnight observation and repeat imaging tomorrow. Eye function has remained intact. No orbital fracture on imaging. ENT c/s deferred in ER given otherwise normal ophtho exam   Hospital Course:  Periorbital hematoma/septum pellucidum hematoma  -appreciate NS recs  -currently no clinical indication for ophto/ENT evaluation of sxs  -repeat CT scan stable  Mechanical fall   -on home hospice- not eligible for home health PT  HTN/CVA  -currently not on medications  -BP mildly elevated at baseline  -Continue to follow   Anemia /pancytopenia -defer to PCP as outpatient  Procedures:    Consultations:    Discharge Exam: Filed Vitals:   03/29/14 1413  BP: 130/50  Pulse: 70  Temp: 98.7 F (37.1 C)  Resp: 16    General: wants to go home Cardiovascular: rrr Respiratory: clear  Discharge Instructions You were cared for by a hospitalist during your hospital stay. If you have any questions about your discharge medications or the care you received while you were in the hospital after you are discharged, you can call the unit and asked to speak with the hospitalist on call if the hospitalist that took care of you is not available. Once you are discharged, your primary care physician will handle any further medical issues. Please note that NO REFILLS for any discharge medications will be authorized once you are discharged, as it is imperative that you return to your primary care physician (or establish a relationship with a primary care physician if you do not have one) for your aftercare needs so that they can reassess your need for medications and monitor your lab values.  Discharge Instructions   Diet general    Complete by:  As directed      Discharge instructions    Complete by:  As directed   Home with hospice 24 hour care by family Cbc, bmp 1 week     Increase activity slowly    Complete by:  As directed  Current Discharge Medication List    CONTINUE these medications which have NOT CHANGED   Details  cholecalciferol (VITAMIN D) 1000 UNITS tablet Take 1,000 Units by mouth daily.    cyanocobalamin 500 MCG tablet Take 500 mcg by mouth daily. Vitamin J62    Garlic 831 MG TABS Take 200 mg by mouth 2 (two) times a week.    Multiple Vitamin (MULTIVITAMIN WITH MINERALS) TABS tablet Take 1 tablet by mouth daily. Centrum Silver over 50     Omega-3 Fatty Acids (FISH OIL) 500 MG CAPS Take 500 mg by mouth 2 (two) times a week.       Allergies  Allergen Reactions  . Penicillins Rash      The results of significant diagnostics from this hospitalization (including imaging, microbiology, ancillary and laboratory) are listed below for reference.    Significant Diagnostic Studies: Ct Head Wo Contrast  03/29/2014   CLINICAL DATA:  Intracranial hemorrhage.  EXAM: CT HEAD WITHOUT CONTRAST  TECHNIQUE: Contiguous axial images were obtained from the base of the skull through the vertex without intravenous contrast.  COMPARISON:  CT scans of the head dated 03/28/2014 and 03/26/2010  FINDINGS: The blood at the septum pellucidum seen on the prior study has now moved to the occipital horns of both lateral ventricles.  No other change. Diffuse cerebral cortical and cerebellar atrophy, most severe in the left cerebellar hemisphere. Chronic small vessel ischemic changes in both cerebral hemispheres, primarily in the frontal lobes and basal ganglia. The soft tissue hematoma in the scalp adjacent to the right superior orbital rim is slightly more prominent.  IMPRESSION: 1. No new hemorrhage. Blood that was present on the septum pellucidum is now in the occipital horns of the lateral ventricles. 2. Slight increased scalp hematoma along the lateral wall of the right orbit.   Electronically Signed   By: Rozetta Nunnery M.D.   On: 03/29/2014 08:22   Ct Head Wo Contrast  03/28/2014   CLINICAL DATA:  Fall yesterday with orbital hematoma  EXAM: CT HEAD WITHOUT CONTRAST  CT MAXILLOFACIAL WITHOUT CONTRAST  TECHNIQUE: Multidetector CT imaging of the head and maxillofacial structures were performed using the standard protocol without intravenous contrast. Multiplanar CT image reconstructions of the maxillofacial structures were also generated.  COMPARISON:  03/26/2010.  FINDINGS: CT HEAD FINDINGS  The bony calvarium is intact. Significant subcutaneous hematoma is  noted about the right orbit consistent with the patient's recent history. Diffuse atrophic changes are seen. There is a rounded 11.6 mm hyperdensity which arises from the septum pellucidum extending into the right lateral ventricle. The patient's recent trauma and that the lesion was not present on prior exams dating back to 2011, this likely represents a posttraumatic hematoma. Followup imaging is recommended. No other focal hemorrhage, infarct or space-occupying mass lesion is noted. Chronic white matter ischemic changes are again noted.  CT MAXILLOFACIAL FINDINGS  Considerable subcutaneous hematoma is noted overlying the right orbit consistent with a recent injury. The underlying bony structures show no evidence of acute fracture. The orbits and their contents are within normal limits. No other soft tissue abnormality is seen. No changes to suggest blowout fracture are noted.  IMPRESSION: CT of the head: Posttraumatic hematoma of the septum pellucidum as described. Short-term followup to assess for resolution is recommended. No other acute abnormality is noted.  CT of the maxillofacial bones: No acute bony abnormality is noted. Considerable soft tissue hematoma is noted over the right orbit consistent with the recent history.   Electronically  Signed   By: Inez Catalina M.D.   On: 03/28/2014 15:33   Ct Maxillofacial Wo Cm  03/28/2014   CLINICAL DATA:  Fall yesterday with orbital hematoma  EXAM: CT HEAD WITHOUT CONTRAST  CT MAXILLOFACIAL WITHOUT CONTRAST  TECHNIQUE: Multidetector CT imaging of the head and maxillofacial structures were performed using the standard protocol without intravenous contrast. Multiplanar CT image reconstructions of the maxillofacial structures were also generated.  COMPARISON:  03/26/2010.  FINDINGS: CT HEAD FINDINGS  The bony calvarium is intact. Significant subcutaneous hematoma is noted about the right orbit consistent with the patient's recent history. Diffuse atrophic changes are  seen. There is a rounded 11.6 mm hyperdensity which arises from the septum pellucidum extending into the right lateral ventricle. The patient's recent trauma and that the lesion was not present on prior exams dating back to 2011, this likely represents a posttraumatic hematoma. Followup imaging is recommended. No other focal hemorrhage, infarct or space-occupying mass lesion is noted. Chronic white matter ischemic changes are again noted.  CT MAXILLOFACIAL FINDINGS  Considerable subcutaneous hematoma is noted overlying the right orbit consistent with a recent injury. The underlying bony structures show no evidence of acute fracture. The orbits and their contents are within normal limits. No other soft tissue abnormality is seen. No changes to suggest blowout fracture are noted.  IMPRESSION: CT of the head: Posttraumatic hematoma of the septum pellucidum as described. Short-term followup to assess for resolution is recommended. No other acute abnormality is noted.  CT of the maxillofacial bones: No acute bony abnormality is noted. Considerable soft tissue hematoma is noted over the right orbit consistent with the recent history.   Electronically Signed   By: Inez Catalina M.D.   On: 03/28/2014 15:33    Microbiology: No results found for this or any previous visit (from the past 240 hour(s)).   Labs: Basic Metabolic Panel:  Recent Labs Lab 03/28/14 1654 03/29/14 0615  NA 135* 136*  K 4.3 3.8  CL 100 102  CO2 22 23  GLUCOSE 93 85  BUN 11 11  CREATININE 0.70 0.72  CALCIUM 8.9 8.4   Liver Function Tests:  Recent Labs Lab 03/29/14 0615  AST 51*  ALT 16  ALKPHOS 87  BILITOT 4.7*  PROT 7.0  ALBUMIN 2.1*   No results found for this basename: LIPASE, AMYLASE,  in the last 168 hours No results found for this basename: AMMONIA,  in the last 168 hours CBC:  Recent Labs Lab 03/28/14 1654 03/29/14 0615  WBC 3.3* 2.7*  NEUTROABS 1.6* 1.2*  HGB 7.8* 7.4*  HCT 21.6* 21.0*  MCV 103.3* 98.6   PLT 63* 57*   Cardiac Enzymes: No results found for this basename: CKTOTAL, CKMB, CKMBINDEX, TROPONINI,  in the last 168 hours BNP: BNP (last 3 results) No results found for this basename: PROBNP,  in the last 8760 hours CBG: No results found for this basename: GLUCAP,  in the last 168 hours     Signed:  Eliseo Squires, Kalem Rockwell  [facility] 03/29/2014, 2:25 PM

## 2014-03-29 NOTE — Evaluation (Signed)
Physical Therapy Evaluation Patient Details Name: Devin Parker MRN: [id number] DOB: 10-16-1934 Today's Date: 03/29/2014   History of Present Illness  Patient is a 79 y/o male admitted s/p mechanical fall with septum pellucidum hematoma and right periorbital hematoma per CT scan. PMH positive for HTN, CVA, TIAs and prostate ca.   Clinical Impression  Patient presents with functional limitations due to deficits listed in PT problem list (see below). Pt with premorbid generalized weakness LLE>RLE due to prior CVA and balance deficits limiting safe mobility. Gait mechanics and speed improved with use of RW vs quad cane. Discussed adding another hand rail for stairs and removing carpet in home for safety. Pt would benefit from acute PT to improve gait, balance and overall safe mobility so pt can maximize independence, minimize falls and ease burden of care. Recommend 24/7 supervision for mobility.     Follow Up Recommendations Home health PT;Supervision/Assistance - 24 hour (Per MD, pt on hospice care at home and may not qualify for HHPT.)    Equipment Recommendations  Rolling walker with 5" wheels    Recommendations for Other Services OT consult     Precautions / Restrictions Precautions Precautions: Fall Restrictions Weight Bearing Restrictions: No      Mobility  Bed Mobility Overal bed mobility: Needs Assistance Bed Mobility: Supine to Sit;Sit to Supine     Supine to sit: Supervision;HOB elevated Sit to supine: Min assist   General bed mobility comments: Required assist mobilizing LEs to get into bed.   Transfers Overall transfer level: Needs assistance Equipment used: Rolling walker (2 wheeled);Quad cane Transfers: Sit to/from Stand Sit to Stand: Min guard         General transfer comment: Min guard for safety upon standing - VC for upright posture. Increased time. Adjusted height on quad cane from home for proper fit.   Ambulation/Gait Ambulation/Gait  assistance: Min guard Ambulation Distance (Feet): 36 Feet (+30' with quad cane.) Assistive device: Rolling walker (2 wheeled);Quad cane Gait Pattern/deviations: Step-to pattern;Step-through pattern;Wide base of support;Decreased stride length;Trunk flexed Gait velocity: decreased   General Gait Details: Step to gait pattern with use of quad cane with wide BoS, close to tripping over cane base on a few occasions. Slow gait speed. VC for upright posture with use of RW for gait - VC to pick up LLE as pt dragging LLE when fatigued (deficit from prior CVA). Gait speed and mechanics improved with use of RW.  Stairs            Wheelchair Mobility    Modified Rankin (Stroke Patients Only)       Balance Overall balance assessment: Needs assistance   Sitting balance-Leahy Scale: Good       Standing balance-Leahy Scale: Poor Standing balance comment: Requires UE support for dynamic standing due to balance deficits and premorbid functional strength in LLE. Able to perform static standing without UE support for short period.                             Pertinent Vitals/Pain Pain Assessment: No/denies pain    Home Living Family/patient expects to be discharged to:: Private residence Living Arrangements: Spouse/significant other Available Help at Discharge: Family;Available PRN/intermittently (wife works.) Type of Home: House Home Access: Stairs to enter Entrance Stairs-Rails: Right Entrance Stairs-Number of Steps: 3 Home Layout: One level Home Equipment: Walker - standard;Cane - quad;Shower seat;Wheelchair - manual      Prior Function Level of Independence: Needs  assistance   Gait / Transfers Assistance Needed: Uses quad cane for ambulation for household distances and uses w/c for community ambulation.  ADL's / Homemaking Assistance Needed: Has CNA come in 2x/week to assist with bathing. (I) with dressing. DOes not do any IADLs. Seats in lift chair at  home.  Comments: Pt on hospice at home.     Hand Dominance        Extremity/Trunk Assessment   Upper Extremity Assessment: Defer to OT evaluation           Lower Extremity Assessment: Generalized weakness;LLE deficits/detail   LLE Deficits / Details: Decreased strength functionally as observed during mobility secondary to prior CVA. WFL during MMT.     Communication   Communication: No difficulties  Cognition Arousal/Alertness: Awake/alert Behavior During Therapy: WFL for tasks assessed/performed Overall Cognitive Status: Within Functional Limits for tasks assessed                      General Comments General comments (skin integrity, edema, etc.): Swelling, purplish ecchymosis periorbital right eye. Pt able to read white board from a distance, no complaints of blurry vision.    Exercises        Assessment/Plan    PT Assessment Patient needs continued PT services  PT Diagnosis Generalized weakness;Difficulty walking   PT Problem List Decreased strength;Decreased activity tolerance;Decreased knowledge of use of DME;Decreased balance;Decreased mobility;Decreased knowledge of precautions;Decreased safety awareness  PT Treatment Interventions DME instruction;Balance training;Gait training;Patient/family education;Functional mobility training;Therapeutic activities;Therapeutic exercise;Stair training   PT Goals (Current goals can be found in the Care Plan section) Acute Rehab PT Goals Patient Stated Goal: to get home PT Goal Formulation: With patient Time For Goal Achievement: 04/12/14 Potential to Achieve Goals: Good    Frequency Min 3X/week   Barriers to discharge Decreased caregiver support Pt home alone during the day- wife works.    Co-evaluation               End of Session Equipment Utilized During Treatment: Gait belt Activity Tolerance: Patient limited by fatigue Patient left: in bed;with call bell/phone within reach;with family/visitor  present Nurse Communication: Mobility status         Time: 1219-1257 PT Time Calculation (min): 38 min   Charges:   PT Evaluation $Initial PT Evaluation Tier I: 1 Procedure PT Treatments $Gait Training: 8-22 mins $Self Care/Home Management: 8-22   PT G CodesCandy Sledge A 03/29/2014, 1:09 PM Candy Sledge, [place], DPT [phone]

## 2014-03-29 NOTE — Clinical Social Work Note (Signed)
CSW made aware by MD (Vann) patient under the care of hospice at home. Per MD, patient may require PT at home. CSW met with patient and wife who was present at bedside. Per wife patient is under hosp[facility] care with [facility] ([phone]). CSW contacted [facility] and spoke with on-call RN (Sheila). Sheila confirmed patient is currently under hospice care with agency. Per Sheila, there is currently no PT benefit for patient under the agency's care. Sheila requested to be contacted once patient is d/c. CSW to make RN and RNCM aware.  Crystal Patrick-Jefferson, LCSWA Weekend Clinical Social Worker [phone]  

## 2014-03-29 NOTE — Progress Notes (Signed)
NURSING PROGRESS NOTE  Devin Parker [id number] Admission Data: 11/25/2013 08:30pm Attending Provider: Shanda Howells, MD JJH:ERDEYCX,KGYJ S, MD Code Status: Full   Devin Parker is a 79 y.o. male patient admitted from ED:  -No acute distress noted.  -No complaints of shortness of breath.  -No complaints of chest pain.   Cardiac Monitoring: n/a   Blood pressure 152/69, pulse 75, temperature 98.2 F (36.8 C), temperature source Oral, resp. rate 16, height 6\' 5"  (1.956 m), weight 86.183 kg (190 lb), SpO2 98.00%.   IV Fluids:  IV in place, occlusive dsg intact without redness, IV cath hand left, condition patent and no redness normal saline.   Allergies:  Penicillins  Past Medical History:   has a past medical history of CVA (cerebral infarction); TIA (transient ischemic attack); Prostate cancer; Hypertension; Prostate cancer; and Stroke.  Past Surgical History:   has no past surgical history on file.  Social History:   reports that he has quit smoking. He does not have any smokeless tobacco history on file. He reports that he does not drink alcohol or use illicit drugs.  Skin: edema to BLE; swelling/edema R eye; hard, dried blisters to BLE  Patient/Family orientated to room. Information packet given to patient/family. Admission inpatient armband information verified with patient/family to include name and date of birth and placed on patient arm. Side rails up x 2, fall assessment and education completed with patient/family. Patient/family able to verbalize understanding of risk associated with falls and verbalized understanding to call for assistance before getting out of bed. Call light within reach. Patient/family able to voice and demonstrate understanding of unit orientation instructions.

## 2014-03-29 NOTE — Progress Notes (Signed)
03/29/14 Patient Going home today. IV site removed and discharge instructions reviewed with family.

## 2014-03-29 NOTE — Progress Notes (Signed)
UR completed 

## 2014-03-29 NOTE — Care Management Note (Signed)
    Page 1 of 1   03/29/2014     3:28:42 PM CARE MANAGEMENT NOTE 03/29/2014  Patient:  Devin Parker, Devin Parker   Account Number:  [id number]  Date Initiated:  03/29/2014  Documentation initiated by:  [facility]  Subjective/Objective Assessment:   adm:  Periorbital hematoma of right eye    mechanical fall     Action/Plan:   discharge planning   Anticipated DC Date:  03/29/2014   Anticipated DC Plan:  [place]  CM consult      Choice offered to / List presented to:             Status of service:  Completed, signed off Medicare Important Message given?   (If response is "NO", the following Medicare IM given date fields will be blank) Date Medicare IM given:   Medicare IM given by:   Date Additional Medicare IM given:   Additional Medicare IM given by:    Discharge Disposition:  [place]  Per UR Regulation:    If discussed at Long Length of Stay Meetings, dates discussed:    Comments:  03/29/14 11:47 CM met with family and gave them a IT sales professional List as they state they need 24 hour supervision for the pt who has recently sustained an injury froma fall.  CM called [facility] and spoke with Adam who states they will not cover HHPT but will get the pt a rolling walker with 5" wheels (as family requested).  Quita Skye says it is not necessary to have an additional order for DME.  No other CM needs were communicated.  Mariane Masters, BSN, CM [phone].

## 2014-04-01 NOTE — Progress Notes (Signed)
PT eval addendum - added G-codes   04-02-2014 1300  PT G-Codes **NOT FOR INPATIENT CLASS**  Functional Assessment Tool Used clinical judgment  Functional Limitation Mobility: Walking and moving around  Mobility: Walking and Moving Around Current Status (Z6109) CJ  Mobility: Walking and Moving Around Goal Status (U0454) CI    Candy Sledge, PT, DPT [phone]

## 2014-04-22 ENCOUNTER — Ambulatory Visit: Payer: Self-pay

## 2014-04-22 LAB — CBC WITH DIFFERENTIAL/PLATELET
BASOS ABS: 0 10*3/uL (ref 0.0–0.1)
Basophil %: 1.2 %
EOS PCT: 2.6 %
Eosinophil #: 0.1 10*3/uL (ref 0.0–0.7)
HCT: 15.4 % — ABNORMAL LOW (ref 40.0–52.0)
HGB: 4.8 g/dL — CL (ref 13.0–18.0)
Lymphocyte #: 0.9 10*3/uL — ABNORMAL LOW (ref 1.0–3.6)
Lymphocyte %: 26.1 %
MCH: 33.3 pg (ref 26.0–34.0)
MCHC: 31.4 g/dL — AB (ref 32.0–36.0)
MCV: 106 fL — ABNORMAL HIGH (ref 80–100)
Monocyte #: 0.8 x10 3/mm (ref 0.2–1.0)
Monocyte %: 21.4 %
Neutrophil #: 1.7 10*3/uL (ref 1.4–6.5)
Neutrophil %: 48.7 %
PLATELETS: 68 10*3/uL — AB (ref 150–440)
RBC: 1.45 10*6/uL — ABNORMAL LOW (ref 4.40–5.90)
RDW: 21 % — ABNORMAL HIGH (ref 11.5–14.5)
WBC: 3.5 10*3/uL — ABNORMAL LOW (ref 3.8–10.6)

## 2014-04-22 LAB — COMPREHENSIVE METABOLIC PANEL
ALBUMIN: 1.9 g/dL — AB (ref 3.4–5.0)
ALT: 21 U/L
ANION GAP: 9 (ref 7–16)
AST: 60 U/L — AB (ref 15–37)
Alkaline Phosphatase: 85 U/L
BUN: 18 mg/dL (ref 7–18)
Bilirubin,Total: 3.3 mg/dL — ABNORMAL HIGH (ref 0.2–1.0)
CALCIUM: 8 mg/dL — AB (ref 8.5–10.1)
CHLORIDE: 102 mmol/L (ref 98–107)
CREATININE: 0.93 mg/dL (ref 0.60–1.30)
Co2: 23 mmol/L (ref 21–32)
EGFR (African American): >60
EGFR (Non-African Amer.): >60
Glucose: 77 mg/dL (ref 65–99)
Osmolality: 269 (ref 275–301)
Potassium: 3.6 mmol/L (ref 3.5–5.1)
SODIUM: 134 mmol/L — AB (ref 136–145)
TOTAL PROTEIN: 6.5 g/dL (ref 6.4–8.2)

## 2014-05-06 ENCOUNTER — Emergency Department (HOSPITAL_COMMUNITY): Payer: Medicare HMO

## 2014-05-06 ENCOUNTER — Emergency Department (HOSPITAL_COMMUNITY)
Admission: EM | Admit: 2014-05-06 | Discharge: 2014-05-06 | Disposition: A | Discharge status: Home / Self Care | Payer: Medicare HMO | Attending: Emergency Medicine | Admitting: Emergency Medicine

## 2014-05-06 ENCOUNTER — Encounter (HOSPITAL_COMMUNITY): Payer: Self-pay | Admitting: Emergency Medicine

## 2014-05-06 DIAGNOSIS — D696 Thrombocytopenia, unspecified: Secondary | ICD-10-CM | POA: Diagnosis not present

## 2014-05-06 DIAGNOSIS — Z8673 Personal history of transient ischemic attack (TIA), and cerebral infarction without residual deficits: Secondary | ICD-10-CM | POA: Diagnosis not present

## 2014-05-06 DIAGNOSIS — E871 Hypo-osmolality and hyponatremia: Secondary | ICD-10-CM | POA: Diagnosis not present

## 2014-05-06 DIAGNOSIS — J9801 Acute bronchospasm: Secondary | ICD-10-CM | POA: Diagnosis not present

## 2014-05-06 DIAGNOSIS — Z87828 Personal history of other (healed) physical injury and trauma: Secondary | ICD-10-CM | POA: Insufficient documentation

## 2014-05-06 DIAGNOSIS — Z515 Encounter for palliative care: Secondary | ICD-10-CM | POA: Diagnosis not present

## 2014-05-06 DIAGNOSIS — Z9981 Dependence on supplemental oxygen: Secondary | ICD-10-CM | POA: Diagnosis not present

## 2014-05-06 DIAGNOSIS — Z87891 Personal history of nicotine dependence: Secondary | ICD-10-CM | POA: Insufficient documentation

## 2014-05-06 DIAGNOSIS — Z8546 Personal history of malignant neoplasm of prostate: Secondary | ICD-10-CM | POA: Diagnosis not present

## 2014-05-06 DIAGNOSIS — Z880 Allergy status to penicillin: Secondary | ICD-10-CM | POA: Diagnosis not present

## 2014-05-06 DIAGNOSIS — Z79899 Other long term (current) drug therapy: Secondary | ICD-10-CM | POA: Insufficient documentation

## 2014-05-06 DIAGNOSIS — R062 Wheezing: Secondary | ICD-10-CM | POA: Diagnosis present

## 2014-05-06 DIAGNOSIS — I10 Essential (primary) hypertension: Secondary | ICD-10-CM | POA: Diagnosis not present

## 2014-05-06 DIAGNOSIS — D649 Anemia, unspecified: Secondary | ICD-10-CM

## 2014-05-06 HISTORY — DX: Nontraumatic intracerebral hemorrhage, unspecified: I61.9

## 2014-05-06 HISTORY — DX: Encounter for palliative care: Z51.5

## 2014-05-06 HISTORY — DX: Acute bronchospasm: J98.01

## 2014-05-06 HISTORY — DX: Other pancytopenia: D61.818

## 2014-05-06 LAB — COMPREHENSIVE METABOLIC PANEL
ALK PHOS: 125 U/L — AB (ref 39–117)
ALT: 23 U/L (ref 0–53)
AST: 68 U/L — ABNORMAL HIGH (ref 0–37)
Albumin: 2.1 g/dL — ABNORMAL LOW (ref 3.5–5.2)
Anion gap: 17 — ABNORMAL HIGH (ref 5–15)
BILIRUBIN TOTAL: 3 mg/dL — AB (ref 0.3–1.2)
BUN: 12 mg/dL (ref 6–23)
CHLORIDE: 92 meq/L — AB (ref 96–112)
CO2: 18 meq/L — AB (ref 19–32)
Calcium: 8.4 mg/dL (ref 8.4–10.5)
Creatinine, Ser: 0.81 mg/dL (ref 0.50–1.35)
GFR, EST NON AFRICAN AMERICAN: 84 mL/min — AB (ref >90)
GLUCOSE: 88 mg/dL (ref 70–99)
POTASSIUM: 3.9 meq/L (ref 3.7–5.3)
SODIUM: 127 meq/L — AB (ref 137–147)
Total Protein: 7.4 g/dL (ref 6.0–8.3)

## 2014-05-06 LAB — CBC WITH DIFFERENTIAL/PLATELET
BASOS ABS: 0 10*3/uL (ref 0.0–0.1)
Basophils Relative: 1 % (ref 0–1)
Eosinophils Absolute: 0.1 10*3/uL (ref 0.0–0.7)
Eosinophils Relative: 4 % (ref 0–5)
HCT: 14.8 % — ABNORMAL LOW (ref 39.0–52.0)
Hemoglobin: 5.1 g/dL — CL (ref 13.0–17.0)
LYMPHS ABS: 1.1 10*3/uL (ref 0.7–4.0)
Lymphocytes Relative: 28 % (ref 12–46)
MCH: 29.8 pg (ref 26.0–34.0)
MCHC: 34.5 g/dL (ref 30.0–36.0)
MCV: 86.5 fL (ref 78.0–100.0)
Monocytes Absolute: 0.7 10*3/uL (ref 0.1–1.0)
Monocytes Relative: 18 % — ABNORMAL HIGH (ref 3–12)
Neutro Abs: 1.9 10*3/uL (ref 1.7–7.7)
Neutrophils Relative %: 49 % (ref 43–77)
PLATELETS: 93 10*3/uL — AB (ref 150–400)
RBC: 1.71 MIL/uL — ABNORMAL LOW (ref 4.22–5.81)
RDW: 18.6 % — ABNORMAL HIGH (ref 11.5–15.5)
WBC: 3.9 10*3/uL — ABNORMAL LOW (ref 4.0–10.5)

## 2014-05-06 LAB — I-STAT TROPONIN, ED: Troponin i, poc: 0.02 ng/mL (ref 0.00–0.08)

## 2014-05-06 LAB — PREPARE RBC (CROSSMATCH)

## 2014-05-06 LAB — NO BLOOD PRODUCTS

## 2014-05-06 LAB — PROTIME-INR
INR: 1.99 — ABNORMAL HIGH (ref 0.00–1.49)
PROTHROMBIN TIME: 22.8 s — AB (ref 11.6–15.2)

## 2014-05-06 LAB — PRO B NATRIURETIC PEPTIDE: Pro B Natriuretic peptide (BNP): 123.2 pg/mL (ref 0–450)

## 2014-05-06 MED ORDER — PREDNISONE 20 MG PO TABS: 40.0000 mg | ORAL_TABLET | Freq: Every day | ORAL | Status: DC

## 2014-05-06 MED ORDER — ALBUTEROL (5 MG/ML) CONTINUOUS INHALATION SOLN
10.0000 mg/h | INHALATION_SOLUTION | Freq: Once | RESPIRATORY_TRACT | Status: AC
Start: 2014-05-06 — End: 2014-05-06
  Administered 2014-05-06: 10 mg/h via RESPIRATORY_TRACT
  Filled 2014-05-06: qty 20

## 2014-05-06 MED ORDER — METHYLPREDNISOLONE SODIUM SUCC 125 MG IJ SOLR
125.0000 mg | Freq: Once | INTRAMUSCULAR | Status: AC
  Administered 2014-05-06: 125 mg via INTRAVENOUS
  Filled 2014-05-06: qty 2

## 2014-05-06 MED ORDER — ALBUTEROL SULFATE (2.5 MG/3ML) 0.083% IN NEBU
5.0000 mg | INHALATION_SOLUTION | Freq: Once | RESPIRATORY_TRACT | Status: AC
  Administered 2014-05-06: 5 mg via RESPIRATORY_TRACT

## 2014-05-06 MED ORDER — IPRATROPIUM BROMIDE 0.02 % IN SOLN
0.5000 mg | Freq: Once | RESPIRATORY_TRACT | Status: AC
  Administered 2014-05-06: 0.5 mg via RESPIRATORY_TRACT

## 2014-05-06 MED ORDER — IPRATROPIUM BROMIDE 0.02 % IN SOLN
RESPIRATORY_TRACT | Status: AC
  Filled 2014-05-06: qty 2.5

## 2014-05-06 MED ORDER — ALBUTEROL SULFATE (2.5 MG/3ML) 0.083% IN NEBU
INHALATION_SOLUTION | RESPIRATORY_TRACT | Status: AC
  Filled 2014-05-06: qty 6

## 2014-05-06 MED ORDER — SODIUM CHLORIDE 0.9 % IV SOLN: Freq: Once | INTRAVENOUS | Status: DC

## 2014-05-06 MED ORDER — IPRATROPIUM BROMIDE 0.02 % IN SOLN
1.0000 mg | Freq: Once | RESPIRATORY_TRACT | Status: AC
  Administered 2014-05-06: 1 mg via RESPIRATORY_TRACT

## 2014-05-06 NOTE — Discharge Instructions (Signed)
°Emergency Department Resource Guide °1) Find a Doctor and Pay Out of Pocket °Although you won't have to find out who is covered by your insurance plan, it is a good idea to ask around and get recommendations. You will then need to call the office and see if the doctor you have chosen will accept you as a new patient and what types of options they offer for patients who are self-pay. Some doctors offer discounts or will set up payment plans for their patients who do not have insurance, but you will need to ask so you aren't surprised when you get to your appointment. ° °2) Contact Your Local Health Department °Not all health departments have doctors that can see patients for sick visits, but many do, so it is worth a call to see if yours does. If you don't know where your local health department is, you can check in your phone book. The CDC also has a tool to help you locate your state's health department, and many state websites also have listings of all of their local health departments. ° °3) Find a Walk-in Clinic °If your illness is not likely to be very severe or complicated, you may want to try a walk in clinic. These are popping up all over the country in pharmacies, drugstores, and shopping centers. They're usually staffed by nurse practitioners or physician assistants that have been trained to treat common illnesses and complaints. They're usually fairly quick and inexpensive. However, if you have serious medical issues or chronic medical problems, these are probably not your best option. ° °No Primary Care Doctor: °- Call Health Connect at  832-8000 - they can help you locate a primary care doctor that  accepts your insurance, provides certain services, etc. °- Physician Referral Service- 1-800-533-3463 ° °Chronic Pain Problems: °Organization         Address  Phone   Notes  °[facility]  (336) 297-2271 Patients need to be referred by their primary care doctor.  ° °Medication  Assistance: °Organization         Address  Phone   Notes  °[place] [facility] [address] °[place], [place] [address] (336) 641-8030 --Must be a resident of [place] °-- Must have NO insurance coverage whatsoever (no Medicaid/ Medicare, etc.) °-- The pt. MUST have a primary care doctor that directs their care regularly and follows them in the community °  °[facility]  (866) 331-1348   °[employer]  (888) 892-1162   ° °Agencies that provide inexpensive medical care: °Organization         Address  Phone   Notes  °[facility]  (336) 832-8035   °[facility]    (336) 832-7272   °[facility] [address] °[place], [place] [address] (336) 832-4777   °[facility] [address], °[place] (336) 271-4999   °[facility]    (336) 373-0678   °[facility]    (336) 272-1050   °[facility] ° [address], [place] Phone:  (336) 832-4444, Fax:  (336) 832-4440 Hours of Operation:  9 am - 6 pm, M-F.  Also accepts Medicaid/Medicare and self-pay.  °[facility] ° [address], [place] Phone: (336) 832-3150, Fax: (336) [phone]. Hours of Operation:  8:30 am - 5:30 pm, M-F.  Also accepts Medicaid and self-pay.  °[facility] [place] [address], [place] Phone: (336) 878-6027   °[facility] [address], [place], [place] (336)723-1848, Ext. 123 Mondays & Thursdays: 7-9 AM.  First 15 patients are seen on a first come, first serve basis. °  ° °Medicaid-accepting [place] Providers: ° °Organization         Address  Phone   Notes  °[facility] [address], [place] (336) 641-2100 Also accepts self-pay patients.  °[facility] [address], [place] ° (336) 856-9996   °[facility] [address], [place]  (336) 288-8857   °[facility] [address] [place] [address], [place] (336) 299-7000   °Veita Bland [address], [place]  ° (336) 373-1557 Only accepts [place] Access Medicaid patients after they have their name applied to their card.  ° °Self-Pay (no insurance) in [place]: ° °Organization         Address  Phone   Notes  °Sickle Cell Patients, [facility] [address], [place] (336) 832-1970   °[facility] [address], [place] (336) 832-4400   °[facility] [place] ° [address], [place] (336) 992-4800   °[facility]/Dr. Osei-Bonsu ° [address] [place] [address], [place] or [address], [place] (336) 841-8500 Phone number for both [place] and [place] locations is the same.  °[facility] [address], [place] (336) 299-0000   °[facility] [address] [place] [address], [place] or [address] (336) 852-7530 °(336) 878-2260   °[facility] [address], [place] (336) 350-1642, phone; (336) 294-5005, fax Sees patients 1st and 3rd Saturday of every month.  Must not qualify for public or private insurance (i.e. Medicaid, Medicare, [place] Health Choice, Veterans' Benefits) • Household income should be no more than 200% of the poverty level •The clinic cannot treat you if you are pregnant or think you are pregnant • Sexually transmitted diseases are not treated at the clinic.  ° ° °Dental Care: °Organization         Address  Phone  Notes  °[place] [facility] [address], [place] (336) 641-6152 Accepts children up to age 21 who are enrolled in Medicaid or [place] Health Choice; pregnant women with a Medicaid card; and children who have applied for Medicaid or [place] Health Choice, but were declined, whose parents can pay a reduced fee at time of service.  °[place]  [facility] [place]  [address], [place] (336) 641-7733 Accepts children up to age 21 who are enrolled in Medicaid or [place] Health Choice; pregnant women with a Medicaid card; and children who have applied for Medicaid or [place] Health Choice, but were declined, whose parents can pay a reduced fee at time of service.  °[facility] ° [address], [place] (336) 641-4533 Patients are seen by appointment only. Walk-ins are not accepted. [facility] will see patients 18 years of age and older. °Monday - Tuesday (8am-5pm) °Most Wednesdays (8:30-5pm) °$30 per visit, cash only  °[facility] ° [address], [place] (336) 641-4533 Patients are seen by appointment only. Walk-ins are not accepted. [facility] will see patients 18 years of age and older. °One   Wednesday Evening (Monthly: Volunteer Based).  $30 per visit, cash only  °[facility]  (919) 537-3737 for adults; Children under age 4, call Graduate Pediatric Dentistry at (919) [phone]. Children aged 4-14, please call (919) [phone] to request a pediatric application. ° Dental services are provided in all areas of dental care including fillings, crowns and bridges, complete and partial dentures, implants, gum treatment, root canals, and extractions. Preventive care is also provided. Treatment is provided to both adults and children. °Patients are selected via a lottery and there is often a waiting list. °  °[facility] [address], °[place] ° (336) 763-8833 www.drcivils.com °  °[facility] [address], [place], [place] (336)723-1848, Ext. 123 Second and Fourth Thursday of each month, opens at 6:30 AM; Clinic ends at 9 AM.  Patients are seen on a first-come first-served basis, and a limited number are seen during each clinic.  ° °[facility] ° [address], [place], [place] (336) 723-7904    Eligibility Requirements °You must have lived in [place], [place], or [place] counties for at least the last three months. °  You cannot be eligible for state or federal sponsored healthcare insurance, including Veterans Administration, Medicaid, or Medicare. °  You generally cannot be eligible for healthcare insurance through your employer.  °  How to apply: °Eligibility screenings are held every Tuesday and Wednesday afternoon from 1:00 pm until 4:00 pm. You do not need an appointment for the interview!  °[facility] [address], [place], [place] 336-631-2330   °[facility]  336-342-8273   °[facility]  336-703-3100   °[facility]  336-570-6415   ° °Behavioral Health Resources in the Community: °Intensive Outpatient Programs °Organization         Address  Phone  Notes  °[facility] [address], [place], [place] 336-878-6098   °[facility] [address], [place], [place] 336-832-9800   °[facility] [address], [place], [place] ° 336-882-2125   °[facility] [address],  °[place], [place] 1-800-853-5163 or 336-641-4981   °Substance Abuse Resources °Organization         Address  Phone  Notes  °[facility]  336-882-2125   °[facility]  336-784-9470   °[facility]  336-285-9073   °[facility]  336-845-3988   °Residential & Outpatient Substance Abuse Program  1-800-659-3381   °Psychological Services °Organization         Address  Phone  Notes  °[facility]  336- 832-9600   °[facility]  336- 378-7881   °[facility] [address], [place] 1-800-853-5163 or 336-641-4981   ° °Mobile Crisis Teams °Organization         Address  Phone  Notes  °[facility], Mobile Crisis Care Unit  1-877-626-1772   °Assertive °[facility] ° [address]  [place],  336-834-9664   °Sharon DeEsch [address] °[place]  336-554-5454   ° °Self-Help/Support Groups °Organization         Address  Phone             Notes  °[facility]  - variety of support groups  336- 373-1402 Call for more information  °Narcotics Anonymous (NA), [facility] [address], °[place]   2 meetings at this location  ° °  Residential Treatment Programs Organization         Address  Phone  Notes  [facility] [address]    [place]  1-(318) 789-3226   [facility]  [address], [place] [address], [place], [place]   [place], [place] [phone] Admissions: 8am-3pm M-F  [facility] [place] [address]    [place], [place] 453-646-8032   [facility] [address] [place], [place], [place]   [facility] [address],  [place], [place]   [facility] Intensive Outpatient [address], [place], [place]   [facility] ([place] [place]  [place], [place] 1-719-224-0117 or (517)532-7368   [facility] [address], [place], [place] Accepts Medicaid  [facility] [address]  [place] 1-503-224-0479 Substance Abuse/Addiction Treatment   [facility] Organization         Address  Phone  Notes  [facility]  854-297-2342   Domenic Schwab, PhD [address] [place], [place]   323-355-9698 or 571-659-4211   Mathis Seymour [place], [place] (671)786-1786   [facility] [address], [place], [place] 334-061-5058 Insurance/Medicaid/sponsorship through [facility] [address]                                    [place], [place] (801)[phone] [place] [address], [place] [phone]    Dr. Adele Schilder  [phone]   [facility] Dept. 1) [address] [place] 2) [place] 3)  [place] [address], [place] [phone]  ([phone]   [place] 517-127-9182 or (567) [phone] (After Hours)       Take the prescription as directed.  Use your albuterol nebulizer (1 unit dose) every 4 hours for the next 7 days, then as needed for cough, wheezing, or shortness of breath.  Call your regular medical doctor tomorrow morning to schedule a follow up appointment within the next 24 to 48 hours.  Return to the Emergency Department immediately sooner if worsening.

## 2014-05-06 NOTE — ED Notes (Addendum)
Pt here with family c/o edema to face around eyes; pt appears pale; pt noted to have wheezing; pt hospice pt from prostate CA; per family pt with hx of fall and head bleed and they are worried could have continued bleeding

## 2014-05-06 NOTE — ED Provider Notes (Signed)
CSN: [id number]     Arrival date & time 05/06/14  1442 History   First MD Initiated Contact with Patient 05/06/14 1546     Chief Complaint  Patient presents with  . Facial Swelling  . Wheezing      HPI Pt was seen at 1605. Per pt and his family, c/o gradual onset and worsening of persistent SOB and wheezing for the past 2 weeks. Pt has been using his home O2 N/C and nebulizer treatments without improvement. Pt's Hospice RN sent pt to the ED for further evaluation. Pt's family states the Hospice staff checked his hemoglobin a few weeks ago and "it was 4." Family is concerned "about bleeding into his head again with his numbers that low" and are requesting a CT head. Pt has not fallen since his previous fall in 03/2014. Pt refuses PRBC transfusion. Pt's family is also concerned regarding pt's "face swells up when he lays down" for the past several weeks. Pt's family states his facial swelling decreases throughout the day when he is sitting upright. Pt denies CP/palpitations, no cough, no abd pain, no N/V/D, no fevers, no rash, no AMS/confusion.     Past Medical History  Diagnosis Date  . CVA (cerebral infarction)   . TIA (transient ischemic attack)   . Hypertension   . Pancytopenia   . Hospice care patient     on home hospice  . ICH (intracerebral hemorrhage) 03/2014    s/p fall  . Prostate cancer   . Bronchospasm     has home O2 N/C and nebulizer   History reviewed. No pertinent past surgical history.  History  Substance Use Topics  . Smoking status: Former Research scientist (life sciences)  . Smokeless tobacco: Not on file  . Alcohol Use: No    Review of Systems ROS: Statement: All systems negative except as marked or noted in the HPI; Constitutional: Negative for fever and chills. ; ; Eyes: Negative for eye pain, redness and discharge. ; ; ENMT: Negative for ear pain, hoarseness, nasal congestion, sinus pressure and sore throat. ; ; Cardiovascular: Negative for chest pain, palpitations, diaphoresis, and  peripheral edema. ; ; Respiratory: +wheezing, SOB. Negative for cough and stridor. ; ; Gastrointestinal: Negative for nausea, vomiting, diarrhea, abdominal pain, blood in stool, hematemesis, jaundice and rectal bleeding. . ; ; Genitourinary: Negative for dysuria, flank pain and hematuria. ; ; Musculoskeletal: Negative for back pain and neck pain. Negative for swelling and trauma.; ; Skin: +intermittent facial swelling. Negative for pruritus, rash, abrasions, blisters, bruising and skin lesion.; ; Neuro: Negative for headache, lightheadedness and neck stiffness. Negative for weakness, altered level of consciousness , altered mental status, extremity weakness, paresthesias, involuntary movement, seizure and syncope.      Allergies  Penicillins  Home Medications   Prior to Admission medications   Medication Sig Start Date End Date Taking? Authorizing Provider  acetaminophen (TYLENOL) 500 MG tablet Take 500 mg by mouth every 6 (six) hours as needed for moderate pain.   Yes Historical Provider, MD  albuterol (PROVENTIL) (2.5 MG/3ML) 0.083% nebulizer solution Inhale 3 mLs into the lungs 2 (two) times daily. 05/02/14  Yes Historical Provider, MD  cholecalciferol (VITAMIN D) 1000 UNITS tablet Take 1,000 Units by mouth daily.   Yes Historical Provider, MD  cyanocobalamin 500 MCG tablet Take 500 mcg by mouth daily. Vitamin B12   Yes Historical Provider, MD  ENSURE PLUS (ENSURE PLUS) LIQD Take 237 mLs by mouth 2 (two) times daily between meals.   Yes Historical Provider,  MD  Garlic 630 MG TABS Take 200 mg by mouth 2 (two) times a week.   Yes Historical Provider, MD  Multiple Vitamin (MULTIVITAMIN WITH MINERALS) TABS tablet Take 1 tablet by mouth daily.    Yes Historical Provider, MD  Omega-3 Fatty Acids (FISH OIL) 500 MG CAPS Take 500 mg by mouth 2 (two) times a week.   Yes Historical Provider, MD  OVER THE COUNTER MEDICATION Apply 1 application topically daily as needed (puts on chest and neck for wheezing  help).   Yes Historical Provider, MD  OXYGEN Inhale 2 L into the lungs as needed (doesn't use daily).   Yes Historical Provider, MD   BP 121/59  Pulse 80  Temp(Src) 97.2 F (36.2 C) (Oral)  Resp 20  Ht 6\' 5"  (1.956 m)  Wt 190 lb (86.183 kg)  BMI 22.53 kg/m2  SpO2 99% Physical Exam 1610; Physical examination:  Nursing notes reviewed; Vital signs and O2 SAT reviewed;  Constitutional: Well developed, Well nourished, Uncomfortable appearing.; Head:  Normocephalic, atraumatic. No facial edema. No rash.; Eyes: EOMI, PERRL, No scleral icterus. Conjunctiva pale; ENMT: Mouth and pharynx normal, Mucous membranes dry; Neck: Supple, Full range of motion, No lymphadenopathy; Cardiovascular: Regular rate and rhythm, No gallop; Respiratory: Breath sounds diminished & equal bilaterally, insp/exp wheezes bilat with audible wheezing.  Speaking few words. Sitting upright. Tachypneic.; Chest: Nontender, Movement normal; Abdomen: Soft, Nontender, Nondistended, Normal bowel sounds; Genitourinary: No CVA tenderness; Extremities: Pulses normal, No tenderness, +3 pedal edema bilat with chronic stasis changes..; Neuro: AA&Ox3, Major CN grossly intact.  Speech clear. No gross focal motor or sensory deficits in extremities.; Skin: Color pale, Warm, Dry.   ED Course  Procedures     EKG Interpretation   Date/Time:  Tuesday May 06 2014 15:02:00 EDT Ventricular Rate:  70 PR Interval:  230 QRS Duration: 82 QT Interval:  454 QTC Calculation: 490 R Axis:   38 Text Interpretation:  Poor data quality, interpretation may be  adversely affected Normal sinus rhythm Anterior infarct , age undetermined  Abnormal ECG Poor data quality in current ECG precludes serial comparison  Confirmed by [facility]  MD, Nunzio Cory [phone]) on 05/06/2014 4:18:39 PM      MDM  MDM Reviewed: previous chart, nursing note and vitals Reviewed previous: labs and ECG Interpretation: ECG, x-ray and labs    Results for orders placed  during the hospital encounter of 05/06/14  PRO B NATRIURETIC PEPTIDE      Result Value Ref Range   Pro B Natriuretic peptide (BNP) 123.2  0 - 450 pg/mL  COMPREHENSIVE METABOLIC PANEL      Result Value Ref Range   Sodium 127 (*) 137 - 147 mEq/L   Potassium 3.9  3.7 - 5.3 mEq/L   Chloride 92 (*) 96 - 112 mEq/L   CO2 18 (*) 19 - 32 mEq/L   Glucose, Bld 88  70 - 99 mg/dL   BUN 12  6 - 23 mg/dL   Creatinine, Ser 0.81  0.50 - 1.35 mg/dL   Calcium 8.4  8.4 - 10.5 mg/dL   Total Protein 7.4  6.0 - 8.3 g/dL   Albumin 2.1 (*) 3.5 - 5.2 g/dL   AST 68 (*) 0 - 37 U/L   ALT 23  0 - 53 U/L   Alkaline Phosphatase 125 (*) 39 - 117 U/L   Total Bilirubin 3.0 (*) 0.3 - 1.2 mg/dL   GFR calc non Af Amer 84 (*) >90 mL/min   GFR calc Af Amer >  90  >90 mL/min   Anion gap 17 (*) 5 - 15  PROTIME-INR      Result Value Ref Range   Prothrombin Time 22.8 (*) 11.6 - 15.2 seconds   INR 1.99 (*) 0.00 - 1.49  CBC WITH DIFFERENTIAL      Result Value Ref Range   WBC 3.9 (*) 4.0 - 10.5 K/uL   RBC 1.71 (*) 4.22 - 5.81 MIL/uL   Hemoglobin 5.1 (*) 13.0 - 17.0 g/dL   HCT 14.8 (*) 39.0 - 52.0 %   MCV 86.5  78.0 - 100.0 fL   MCH 29.8  26.0 - 34.0 pg   MCHC 34.5  30.0 - 36.0 g/dL   RDW 18.6 (*) 11.5 - 15.5 %   Platelets 93 (*) 150 - 400 K/uL   Neutrophils Relative % 49  43 - 77 %   Neutro Abs 1.9  1.7 - 7.7 K/uL   Lymphocytes Relative 28  12 - 46 %   Lymphs Abs 1.1  0.7 - 4.0 K/uL   Monocytes Relative 18 (*) 3 - 12 %   Monocytes Absolute 0.7  0.1 - 1.0 K/uL   Eosinophils Relative 4  0 - 5 %   Eosinophils Absolute 0.1  0.0 - 0.7 K/uL   Basophils Relative 1  0 - 1 %   Basophils Absolute 0.0  0.0 - 0.1 K/uL  I-STAT TROPOININ, ED      Result Value Ref Range   Troponin i, poc 0.02  0.00 - 0.08 ng/mL   Comment 3           TYPE AND SCREEN      Result Value Ref Range   ABO/RH(D) B POS     Antibody Screen POS     Sample Expiration 05/09/2014     DAT, IgG NEG     Antibody Identification NO CLINICALLY SIGNIFICANT  ANTIBODY IDENTIFIED     Unit Number [id number]     Blood Component Type RED CELLS,LR     Unit division 00     Status of Unit ALLOCATED     Transfusion Status OK TO TRANSFUSE     Crossmatch Result COMPATIBLE     Unit Number [id number]     Blood Component Type RED CELLS,LR     Unit division 00     Status of Unit ALLOCATED     Transfusion Status OK TO TRANSFUSE     Crossmatch Result COMPATIBLE    PREPARE RBC (CROSSMATCH)      Result Value Ref Range   Order Confirmation ORDER PROCESSED BY BLOOD BANK     Dg Chest 2 View 05/06/2014   CLINICAL DATA:  Shortness of breath.  EXAM: CHEST  2 VIEW  COMPARISON:  February 07, 2013.  FINDINGS: Stable cardiomediastinal silhouette. Mild left pleural effusion is noted which is increased compared to prior exam. No pneumothorax is noted. Minimal right basilar subsegmental atelectasis is noted. Small nodular density is seen in right lung base which may represent overlying nipple shadow. Bony thorax is intact.  IMPRESSION: Mild right pleural effusion is noted which is increased compared to prior exam. Minimal right basilar subsegmental atelectasis is noted.  Small nodular density is seen in right lung base which may represent overlying nipple shadow. Repeat radiograph with nipple markers is recommended to rule out pulmonary nodule.   Electronically Signed   By: Sabino Dick M.D.   On: 05/06/2014 16:06    0710:  Pt refuses PRBC transfusion. Pt was agreeable to have  hour long neb treatment and IV solumedrol. Neb completed: lungs continue with insp/exp wheezes, but no further audible wheezing, Sats 99% R/A, speaking in sentences to multiple family members at bedside, appears more comfortable than on arrival.  Admission recommended; pt adamantly refuses, stating "I'm going home right now." Pt states he has a neb machine and "enough solution for it" at home. Pt and family informed re: dx testing results, including low Hgb, hyponatremia, and lungs with continue wheezing,  and that I recommend admission for further evaluation.  Pt refuses admission.  I encouraged pt to stay, continues to refuse.  Pt makes his own medical decisions.  Risks of AMA explained to pt and family, including, but not limited to:  stroke, heart attack, cardiac arrythmia ("irregular heart rate/beat"), "passing out," temporary and/or permanent disability, death.  Pt and family verb understanding and continue to refuse admission, understanding the consequences of their decision.  I encouraged pt to follow up with his PMD or Hospice MD tomorrow and return to the ED immediately if symptoms worsen, he changes his mind, or for any other concerns.       Francine Graven, DO 05/08/14 1759

## 2014-05-06 NOTE — ED Notes (Signed)
Pt being brought to room by x-ray.  Pt's family waiting in room

## 2014-05-06 NOTE — ED Notes (Signed)
Pt st's he is breathing better after breathing tx.  Pt sitting on end of stretcher with feet on floor.

## 2014-05-07 ENCOUNTER — Encounter (HOSPITAL_COMMUNITY): Payer: Self-pay | Admitting: Emergency Medicine

## 2014-05-07 ENCOUNTER — Emergency Department (HOSPITAL_COMMUNITY): Payer: Medicare Other

## 2014-05-07 ENCOUNTER — Inpatient Hospital Stay (HOSPITAL_COMMUNITY)
Admission: EM | Admit: 2014-05-07 | Discharge: 2014-05-09 | DRG: 811 | Disposition: A | Discharge status: Hospice | Payer: Medicare Other | Attending: Cardiovascular Disease | Admitting: Cardiovascular Disease

## 2014-05-07 DIAGNOSIS — Z87891 Personal history of nicotine dependence: Secondary | ICD-10-CM

## 2014-05-07 DIAGNOSIS — D696 Thrombocytopenia, unspecified: Secondary | ICD-10-CM | POA: Diagnosis present

## 2014-05-07 DIAGNOSIS — F1010 Alcohol abuse, uncomplicated: Secondary | ICD-10-CM | POA: Diagnosis present

## 2014-05-07 DIAGNOSIS — J90 Pleural effusion, not elsewhere classified: Secondary | ICD-10-CM | POA: Diagnosis present

## 2014-05-07 DIAGNOSIS — I5023 Acute on chronic systolic (congestive) heart failure: Secondary | ICD-10-CM | POA: Diagnosis present

## 2014-05-07 DIAGNOSIS — E871 Hypo-osmolality and hyponatremia: Secondary | ICD-10-CM | POA: Diagnosis present

## 2014-05-07 DIAGNOSIS — I10 Essential (primary) hypertension: Secondary | ICD-10-CM | POA: Diagnosis present

## 2014-05-07 DIAGNOSIS — R042 Hemoptysis: Secondary | ICD-10-CM | POA: Diagnosis present

## 2014-05-07 DIAGNOSIS — Z8673 Personal history of transient ischemic attack (TIA), and cerebral infarction without residual deficits: Secondary | ICD-10-CM

## 2014-05-07 DIAGNOSIS — Z880 Allergy status to penicillin: Secondary | ICD-10-CM

## 2014-05-07 DIAGNOSIS — Z66 Do not resuscitate: Secondary | ICD-10-CM | POA: Diagnosis present

## 2014-05-07 DIAGNOSIS — Z515 Encounter for palliative care: Secondary | ICD-10-CM

## 2014-05-07 DIAGNOSIS — J189 Pneumonia, unspecified organism: Secondary | ICD-10-CM | POA: Diagnosis present

## 2014-05-07 DIAGNOSIS — C61 Malignant neoplasm of prostate: Secondary | ICD-10-CM | POA: Diagnosis present

## 2014-05-07 DIAGNOSIS — E8809 Other disorders of plasma-protein metabolism, not elsewhere classified: Secondary | ICD-10-CM | POA: Diagnosis present

## 2014-05-07 DIAGNOSIS — K7030 Alcoholic cirrhosis of liver without ascites: Secondary | ICD-10-CM | POA: Diagnosis present

## 2014-05-07 DIAGNOSIS — T402X5A Adverse effect of other opioids, initial encounter: Secondary | ICD-10-CM | POA: Diagnosis not present

## 2014-05-07 DIAGNOSIS — D649 Anemia, unspecified: Secondary | ICD-10-CM | POA: Diagnosis present

## 2014-05-07 DIAGNOSIS — L299 Pruritus, unspecified: Secondary | ICD-10-CM | POA: Diagnosis not present

## 2014-05-07 DIAGNOSIS — K922 Gastrointestinal hemorrhage, unspecified: Secondary | ICD-10-CM

## 2014-05-07 DIAGNOSIS — R0602 Shortness of breath: Secondary | ICD-10-CM | POA: Diagnosis present

## 2014-05-07 DIAGNOSIS — D500 Iron deficiency anemia secondary to blood loss (chronic): Principal | ICD-10-CM | POA: Diagnosis present

## 2014-05-07 LAB — BASIC METABOLIC PANEL
Anion gap: 19 — ABNORMAL HIGH (ref 5–15)
BUN: 17 mg/dL (ref 6–23)
CALCIUM: 8.8 mg/dL (ref 8.4–10.5)
CHLORIDE: 94 meq/L — AB (ref 96–112)
CO2: 16 mEq/L — ABNORMAL LOW (ref 19–32)
Creatinine, Ser: 0.99 mg/dL (ref 0.50–1.35)
GFR calc non Af Amer: 78 mL/min — ABNORMAL LOW (ref >90)
GFR, EST AFRICAN AMERICAN: 90 mL/min — AB (ref >90)
Glucose, Bld: 100 mg/dL — ABNORMAL HIGH (ref 70–99)
Potassium: 4.3 mEq/L (ref 3.7–5.3)
SODIUM: 129 meq/L — AB (ref 137–147)

## 2014-05-07 LAB — CBC
HCT: 14.4 % — ABNORMAL LOW (ref 39.0–52.0)
Hemoglobin: 4.8 g/dL — CL (ref 13.0–17.0)
MCH: 28.4 pg (ref 26.0–34.0)
MCHC: 33.3 g/dL (ref 30.0–36.0)
MCV: 85.2 fL (ref 78.0–100.0)
PLATELETS: 105 10*3/uL — AB (ref 150–400)
RBC: 1.69 MIL/uL — ABNORMAL LOW (ref 4.22–5.81)
RDW: 18.9 % — ABNORMAL HIGH (ref 11.5–15.5)
WBC: 7.3 10*3/uL (ref 4.0–10.5)

## 2014-05-07 LAB — TYPE AND SCREEN
ABO/RH(D): B POS
Antibody Screen: POSITIVE
DAT, IgG: NEGATIVE
UNIT DIVISION: 0
Unit division: 0

## 2014-05-07 LAB — I-STAT TROPONIN, ED: Troponin i, poc: 0.02 ng/mL (ref 0.00–0.08)

## 2014-05-07 LAB — PRO B NATRIURETIC PEPTIDE: PRO B NATRI PEPTIDE: 469.4 pg/mL — AB (ref 0–450)

## 2014-05-07 LAB — PREPARE RBC (CROSSMATCH)

## 2014-05-07 MED ORDER — SODIUM CHLORIDE 0.9 % IV SOLN: Freq: Once | INTRAVENOUS | Status: DC

## 2014-05-07 MED ORDER — ALBUTEROL (5 MG/ML) CONTINUOUS INHALATION SOLN
10.0000 mg/h | INHALATION_SOLUTION | Freq: Once | RESPIRATORY_TRACT | Status: AC
  Administered 2014-05-07: 10 mg/h via RESPIRATORY_TRACT
  Filled 2014-05-07: qty 20

## 2014-05-07 MED ORDER — DEXTROSE 5 % IV SOLN
1.0000 mg/h | INTRAVENOUS | Status: DC
  Administered 2014-05-07: 1 mg/h via INTRAVENOUS
  Filled 2014-05-07: qty 10

## 2014-05-07 MED ORDER — ONDANSETRON HCL 4 MG/2ML IJ SOLN: 4.0000 mg | Freq: Once | INTRAMUSCULAR | Status: DC

## 2014-05-07 MED ORDER — ONDANSETRON HCL 4 MG/2ML IJ SOLN: 4.0000 mg | Freq: Four times a day (QID) | INTRAMUSCULAR | Status: DC | PRN

## 2014-05-07 MED ORDER — SODIUM CHLORIDE 0.9 % IJ SOLN
3.0000 mL | INTRAMUSCULAR | Status: DC | PRN
Start: 2014-05-07 — End: 2014-05-09

## 2014-05-07 MED ORDER — SODIUM CHLORIDE 0.9 % IJ SOLN
3.0000 mL | Freq: Two times a day (BID) | INTRAMUSCULAR | Status: DC
  Administered 2014-05-08 (×2): 3 mL via INTRAVENOUS

## 2014-05-07 MED ORDER — SODIUM CHLORIDE 0.9 % IJ SOLN: 3.0000 mL | INTRAMUSCULAR | Status: DC | PRN

## 2014-05-07 MED ORDER — MORPHINE SULFATE 2 MG/ML IJ SOLN
1.0000 mg | INTRAMUSCULAR | Status: DC | PRN
  Administered 2014-05-07: 1 mg via INTRAVENOUS
  Filled 2014-05-07 (×2): qty 1

## 2014-05-07 MED ORDER — SODIUM CHLORIDE 0.9 % IJ SOLN
3.0000 mL | Freq: Two times a day (BID) | INTRAMUSCULAR | Status: DC
  Administered 2014-05-08 – 2014-05-09 (×2): 3 mL via INTRAVENOUS

## 2014-05-07 MED ORDER — ONDANSETRON HCL 4 MG PO TABS: 4.0000 mg | ORAL_TABLET | Freq: Four times a day (QID) | ORAL | Status: DC | PRN

## 2014-05-07 MED ORDER — SODIUM CHLORIDE 0.9 % IV SOLN: 250.0000 mL | INTRAVENOUS | Status: DC | PRN

## 2014-05-07 MED ORDER — ALBUTEROL (5 MG/ML) CONTINUOUS INHALATION SOLN: 10.0000 mg/h | INHALATION_SOLUTION | RESPIRATORY_TRACT | Status: DC

## 2014-05-07 MED ORDER — LORAZEPAM 1 MG PO TABS: 1.0000 mg | ORAL_TABLET | ORAL | Status: DC | PRN

## 2014-05-07 NOTE — H&P (Signed)
Referring Physician:  Ranger Petrich is an 79 y.o. male.                       Chief Complaint: Shortness of breath  HPI: 79 year old hospice patient with PMH of CVA, prostate CA, HTN and alcohol abuse has recurrent bleed has severe anemia. Patient has refused blood transfusions in the past and today. He also has chronic alcoholic cirrhosis with elevated INR without medications. He also has significant hyponatremia with bilateral leg edema and bilateral pleural effusions and possible air-space disease. Family members and patient are in agreement with comfort care.  Past Medical History  Diagnosis Date  . CVA (cerebral infarction)   . TIA (transient ischemic attack)   . Hypertension   . Pancytopenia   . Hospice care patient     on home hospice  . ICH (intracerebral hemorrhage) 03/2014    s/p fall  . Prostate cancer   . Bronchospasm     has home O2 N/C and nebulizer      History reviewed. No pertinent past surgical history.  History reviewed. No pertinent family history. Social History:  reports that he has quit smoking. He does not have any smokeless tobacco history on file. He reports that he does not drink alcohol or use illicit drugs.  Allergies:  Allergies  Allergen Reactions  . Penicillins Rash     (Not in a hospital admission)  Results for orders placed during the hospital encounter of 05/07/14 (from the past 48 hour(s))  CBC     Status: Abnormal   Collection Time    05/07/14  5:43 PM      Result Value Ref Range   WBC 7.3  4.0 - 10.5 K/uL   RBC 1.69 (*) 4.22 - 5.81 MIL/uL   Hemoglobin 4.8 (*) 13.0 - 17.0 g/dL   Comment: REPEATED TO VERIFY     CRITICAL VALUE NOTED.  VALUE IS CONSISTENT WITH PREVIOUSLY REPORTED AND CALLED VALUE.   HCT 14.4 (*) 39.0 - 52.0 %   MCV 85.2  78.0 - 100.0 fL   MCH 28.4  26.0 - 34.0 pg   MCHC 33.3  30.0 - 36.0 g/dL   RDW 18.9 (*) 11.5 - 15.5 %   Platelets 105 (*) 150 - 400 K/uL   Comment: CONSISTENT WITH PREVIOUS RESULT  BASIC  METABOLIC PANEL     Status: Abnormal   Collection Time    05/07/14  5:43 PM      Result Value Ref Range   Sodium 129 (*) 137 - 147 mEq/L   Potassium 4.3  3.7 - 5.3 mEq/L   Chloride 94 (*) 96 - 112 mEq/L   CO2 16 (*) 19 - 32 mEq/L   Glucose, Bld 100 (*) 70 - 99 mg/dL   BUN 17  6 - 23 mg/dL   Creatinine, Ser 0.99  0.50 - 1.35 mg/dL   Calcium 8.8  8.4 - 10.5 mg/dL   GFR calc non Af Amer 78 (*) >90 mL/min   GFR calc Af Amer 90 (*) >90 mL/min   Comment: (NOTE)     The eGFR has been calculated using the CKD EPI equation.     This calculation has not been validated in all clinical situations.     eGFR's persistently <90 mL/min signify possible Chronic Kidney     Disease.   Anion gap 19 (*) 5 - 15  PRO B NATRIURETIC PEPTIDE     Status: Abnormal  Collection Time    05/07/14  5:43 PM      Result Value Ref Range   Pro B Natriuretic peptide (BNP) 469.4 (*) 0 - 450 pg/mL  TYPE AND SCREEN     Status: None   Collection Time    05/07/14  5:55 PM      Result Value Ref Range   ABO/RH(D) B POS     Antibody Screen POS     Sample Expiration 05/10/2014     DAT, IgG NEG     Unit Number [id number]     Blood Component Type RED CELLS,LR     Unit division 00     Status of Unit ALLOCATED     Transfusion Status OK TO TRANSFUSE     Crossmatch Result COMPATIBLE     Unit Number [id number]     Blood Component Type RED CELLS,LR     Unit division 00     Status of Unit ALLOCATED     Transfusion Status OK TO TRANSFUSE     Crossmatch Result COMPATIBLE    PREPARE RBC (CROSSMATCH)     Status: None   Collection Time    05/07/14  5:55 PM      Result Value Ref Range   Order Confirmation ORDER PROCESSED BY BLOOD BANK    I-STAT TROPOININ, ED     Status: None   Collection Time    05/07/14  6:03 PM      Result Value Ref Range   Troponin i, poc 0.02  0.00 - 0.08 ng/mL   Comment 3            Comment: Due to the release kinetics of cTnI,     a negative result within the first hours     of the onset of  symptoms does not rule out     myocardial infarction with certainty.     If myocardial infarction is still suspected,     repeat the test at appropriate intervals.   Dg Chest 2 View  05/06/2014   CLINICAL DATA:  Shortness of breath.  EXAM: CHEST  2 VIEW  COMPARISON:  February 07, 2013.  FINDINGS: Stable cardiomediastinal silhouette. Mild left pleural effusion is noted which is increased compared to prior exam. No pneumothorax is noted. Minimal right basilar subsegmental atelectasis is noted. Small nodular density is seen in right lung base which may represent overlying nipple shadow. Bony thorax is intact.  IMPRESSION: Mild right pleural effusion is noted which is increased compared to prior exam. Minimal right basilar subsegmental atelectasis is noted.  Small nodular density is seen in right lung base which may represent overlying nipple shadow. Repeat radiograph with nipple markers is recommended to rule out pulmonary nodule.   Electronically Signed   By: Roque Lias M.D.   On: 05/06/2014 16:06   Ct Head Wo Contrast  05/06/2014   CLINICAL DATA:  Fall.  EXAM: CT HEAD WITHOUT CONTRAST  TECHNIQUE: Contiguous axial images were obtained from the base of the skull through the vertex without intravenous contrast.  COMPARISON:  CT scan of March 29, 2014.  FINDINGS: Bony calvarium appears intact. Mild diffuse cortical atrophy is noted. Mild chronic ischemic white matter disease is noted. Old lacunar infarct is noted in right basal ganglia. No mass effect or midline shift is noted. Ventricular size is within normal limits. There is no evidence of mass lesion, hemorrhage or acute infarction.  IMPRESSION: Mild diffuse cortical atrophy. Mild chronic ischemic white matter disease. No acute intracranial  abnormality seen.   Electronically Signed   By: Sabino Dick M.D.   On: 05/06/2014 17:00   Dg Chest Portable 1 View  05/07/2014   CLINICAL DATA:  Shortness of breath with wheezing since yesterday. Subsequent  encounter.  EXAM: PORTABLE CHEST - 1 VIEW  COMPARISON:  Radiographs 05/06/2014 and 02/07/2013.  FINDINGS: 1831 hr. Left pleural effusion and patchy left basilar airspace disease have not significantly changed since yesterday. The right lung is clear. The heart size and mediastinal contours are stable. There is no pneumothorax. The osseous structures appear stable.  IMPRESSION: Left pleural effusion and left basilar airspace disease are unchanged from yesterday. These have increased from prior studies done 3 months ago and could reflect pneumonia. Close radiographic follow up recommended. If clinically warranted, this could be further evaluated with CT.   Electronically Signed   By: Camie Patience M.D.   On: 05/07/2014 18:46    Review Of Systems Constitutional: Negative for fever and chills.  HENT: Negative for congestion and sore throat.  Respiratory: Positive for cough and shortness of breath.  Cardiovascular: Negative for chest pain and positive for leg swelling.  Gastrointestinal: Negative for nausea, vomiting, abdominal pain, diarrhea and constipation.  Genitourinary: Negative for dysuria and frequency.  Skin: Negative for rash.  Neurological: Positive for weakness. Negative for dizziness and headaches.  Psychiatric/Behavioral: Negative for confusion and agitation.   Blood pressure 136/54, pulse 100, temperature 97.3 F (36.3 C), resp. rate 16, SpO2 100.00%. Constitutional: WD,averagely nourished and short of breath. HEENT: [place]/AT, Brown eyes, pale conjunctiva, muddy sclera. Neck: + JVD at 30 degree angle. Supple, No lymphadenopathy. [place] : Bilateral basal crackles and some rhonchi. Somewhat tachypneic and speaking few words. Heart: Tachycardic, S 1 and S 2 normal. S 3 present.  Abdomen: Soft, Non-tender. Extremities: 3 + edema with chronic venous stasis changes. CNS: Awake, cranial nerves grossly intact. Moves all 4 extremities. Skin: Pale, dry.  Assessment/Plan Severe Anemia Acute on  chronic left heart systolic failure Possible pneumonia CA prostate Alcoholic Liver cirrhosis with abnormal liver function test Chronic alcohol abuse Hyponatremia Thrombocytopenia H/O stroke/TIA  Place in observation/Comfort care/DNR.  [name], MD  05/07/2014, 7:56 PM

## 2014-05-07 NOTE — ED Notes (Signed)
Pt here for SOB, wheezing, low blood count of 5. Pt was suppose to be admitted yesterday for blood transfusion and refused.

## 2014-05-07 NOTE — ED Notes (Signed)
Pt resting comfortably; family at bedside; Mariann Laster trying to get in touch with hospice RN.

## 2014-05-07 NOTE — ED Notes (Signed)
ED PA and family's PCP at bedside.

## 2014-05-07 NOTE — ED Notes (Signed)
Pt has agreed to stay with wife at bedside.

## 2014-05-07 NOTE — Progress Notes (Addendum)
Patient is receiving Hospice Care, Patient presented to [facility] ED with SOB, was also in  ED yesterday with low HGb. 5.1.but refused transfusion. Hemogloblin continues to fall 4.7 . Patient evaluated by Dr. Doylene Canard he spoke  with family about comfort care. Family agreeable. Patient was made DNR.  Spoke with Benjamine Mola RN who stated, that a morphine drip was ordered, but patient wants to return home tonight. [place] regarding patient going home or inpatient hospice, spoke with Lorriane Shire [phone], she stated that they do not have the staff to go out tonight. This ED CM inquired about their inpatient hospice and was told that a inter-hospice transfer would need to take place but they need to check on the bed availability. ED CM spoke with patient and wife at bedside. Patient states, he would stay if wife stayed with him. Spoke with Benjamine Mola and A. Francee Piccolo, verified with patient that he is agreeable with admission for comfort care. ED CM will notify Hospice/ Palliative Coordinator Devin Going to follow up.

## 2014-05-07 NOTE — ED Notes (Signed)
Dr. Doylene Canard [phone]. Please call if pt decides to go home.

## 2014-05-07 NOTE — ED Provider Notes (Signed)
CSN: [id number]     Arrival date & time 05/07/14  1729 History   First MD Initiated Contact with Patient 05/07/14 1749     Chief Complaint  Patient presents with  . Shortness of Breath  . Wheezing  . GI Bleeding     (Consider location/radiation/quality/duration/timing/severity/associated sxs/prior Treatment) The history is provided by the patient. No language interpreter was used.    Pt here for SOB and wheezing along with bleeding;. He is a hospice paitneti on chronic 02 via cannula  2L.Pateitn notyee to have a hgb of 5.1 yesterday but refused admission and transfusion. He has returned for treatment of his wheezing and is requesting transfusion at this time. Pt has been using his home O2 N/C and nebulizer treatments without improvement. He has a hx of pancytopenia and bleeding has frequent bleeds (epistaxis).Today the patient began coughing up blood. Pt denies CP/palpitations, no cough, no abd pain, no N/V/D, no fevers, no rash, no AMS/confusion.     Past Medical History  Diagnosis Date  . CVA (cerebral infarction)   . TIA (transient ischemic attack)   . Hypertension   . Pancytopenia   . Hospice care patient     on home hospice  . ICH (intracerebral hemorrhage) 03/2014    s/p fall  . Prostate cancer   . Bronchospasm     has home O2 N/C and nebulizer   History reviewed. No pertinent past surgical history. History reviewed. No pertinent family history. History  Substance Use Topics  . Smoking status: Former Research scientist (life sciences)  . Smokeless tobacco: Not on file  . Alcohol Use: No    Review of Systems  Ten systems reviewed and are negative for acute change, except as noted in the HPI.    Allergies  Penicillins  Home Medications   Prior to Admission medications   Medication Sig Start Date End Date Taking? Authorizing Provider  acetaminophen (TYLENOL) 500 MG tablet Take 500 mg by mouth every 6 (six) hours as needed for moderate pain.    Historical Provider, MD  albuterol  (PROVENTIL) (2.5 MG/3ML) 0.083% nebulizer solution Inhale 3 mLs into the lungs 2 (two) times daily. 05/02/14   Historical Provider, MD  cholecalciferol (VITAMIN D) 1000 UNITS tablet Take 1,000 Units by mouth daily.    Historical Provider, MD  cyanocobalamin 500 MCG tablet Take 500 mcg by mouth daily. Vitamin B12    Historical Provider, MD  ENSURE PLUS (ENSURE PLUS) LIQD Take 237 mLs by mouth 2 (two) times daily between meals.    Historical Provider, MD  Garlic 867 MG TABS Take 200 mg by mouth 2 (two) times a week.    Historical Provider, MD  Multiple Vitamin (MULTIVITAMIN WITH MINERALS) TABS tablet Take 1 tablet by mouth daily.     Historical Provider, MD  Omega-3 Fatty Acids (FISH OIL) 500 MG CAPS Take 500 mg by mouth 2 (two) times a week.    Historical Provider, MD  OVER THE COUNTER MEDICATION Apply 1 application topically daily as needed (puts on chest and neck for wheezing help).    Historical Provider, MD  OXYGEN Inhale 2 L into the lungs as needed (doesn't use daily).    Historical Provider, MD  predniSONE (DELTASONE) 20 MG tablet Take 2 tablets (40 mg total) by mouth daily. Start 05/07/2014 05/06/14   Francine Graven, DO   BP 149/67  Pulse 90  Temp(Src) 97.3 F (36.3 C)  Resp 16  SpO2 100% Physical Exam Physical examination: Nursing notes reviewed; Vital signs and O2  SAT reviewed; Constitutional: Well developed, Well nourished, Uncomfortable appearing.; Head: Normocephalic, atraumatic. No facial edema. No rash.; Eyes: EOMI, PERRL, No scleral icterus. Conjunctiva pale; ENMT: Mouth and pharynx normal, Mucous membranes dry; Neck: Supple, Full range of motion, No lymphadenopathy; Cardiovascular: Regular rate and rhythm, No gallop; Respiratory: Breath sounds diminished & equal bilaterally, insp/exp wheezes bilat with audible wheezing. Speaking few words. Sitting upright. Tachypneic.; Chest: Nontender, Movement normal; Abdomen: Soft, Nontender, Nondistended, Normal bowel sounds; Genitourinary: No  CVA tenderness; Extremities: Pulses normal, No tenderness, +3 pedal edema bilat with chronic stasis changes..; Neuro: AA&Ox3, Major CN grossly intact. Speech clear. No gross focal motor or sensory deficits in extremities.; Skin: Color pale, Warm, Dry  ED Course  Procedures (including critical care time) Labs Review Labs Reviewed  CBC - Abnormal; Notable for the following:    RBC 1.69 (*)    Hemoglobin 4.8 (*)    HCT 14.4 (*)    RDW 18.9 (*)    Platelets 105 (*)    All other components within normal limits  BASIC METABOLIC PANEL - Abnormal; Notable for the following:    Sodium 129 (*)    Chloride 94 (*)    CO2 16 (*)    Glucose, Bld 100 (*)    GFR calc non Af Amer 78 (*)    GFR calc Af Amer 90 (*)    Anion gap 19 (*)    All other components within normal limits  PRO B NATRIURETIC PEPTIDE - Abnormal; Notable for the following:    Pro B Natriuretic peptide (BNP) 469.4 (*)    All other components within normal limits  I-STAT TROPOININ, ED  TYPE AND SCREEN  PREPARE RBC (CROSSMATCH)    Imaging Review Dg Chest 2 View  05/06/2014   CLINICAL DATA:  Shortness of breath.  EXAM: CHEST  2 VIEW  COMPARISON:  February 07, 2013.  FINDINGS: Stable cardiomediastinal silhouette. Mild left pleural effusion is noted which is increased compared to prior exam. No pneumothorax is noted. Minimal right basilar subsegmental atelectasis is noted. Small nodular density is seen in right lung base which may represent overlying nipple shadow. Bony thorax is intact.  IMPRESSION: Mild right pleural effusion is noted which is increased compared to prior exam. Minimal right basilar subsegmental atelectasis is noted.  Small nodular density is seen in right lung base which may represent overlying nipple shadow. Repeat radiograph with nipple markers is recommended to rule out pulmonary nodule.   Electronically Signed   By: Sabino Dick M.D.   On: 05/06/2014 16:06   Ct Head Wo Contrast  05/06/2014   CLINICAL DATA:  Fall.   EXAM: CT HEAD WITHOUT CONTRAST  TECHNIQUE: Contiguous axial images were obtained from the base of the skull through the vertex without intravenous contrast.  COMPARISON:  CT scan of March 29, 2014.  FINDINGS: Bony calvarium appears intact. Mild diffuse cortical atrophy is noted. Mild chronic ischemic white matter disease is noted. Old lacunar infarct is noted in right basal ganglia. No mass effect or midline shift is noted. Ventricular size is within normal limits. There is no evidence of mass lesion, hemorrhage or acute infarction.  IMPRESSION: Mild diffuse cortical atrophy. Mild chronic ischemic white matter disease. No acute intracranial abnormality seen.   Electronically Signed   By: Sabino Dick M.D.   On: 05/06/2014 17:00   Dg Chest Portable 1 View  05/07/2014   CLINICAL DATA:  Shortness of breath with wheezing since yesterday. Subsequent encounter.  EXAM: PORTABLE CHEST - 1  VIEW  COMPARISON:  Radiographs 05/06/2014 and 02/07/2013.  FINDINGS: 1831 hr. Left pleural effusion and patchy left basilar airspace disease have not significantly changed since yesterday. The right lung is clear. The heart size and mediastinal contours are stable. There is no pneumothorax. The osseous structures appear stable.  IMPRESSION: Left pleural effusion and left basilar airspace disease are unchanged from yesterday. These have increased from prior studies done 3 months ago and could reflect pneumonia. Close radiographic follow up recommended. If clinically warranted, this could be further evaluated with CT.   Electronically Signed   By: Camie Patience M.D.   On: 05/07/2014 18:46     EKG Interpretation None      MDM   Final diagnoses:  None    6:55 PM BP 149/67  Pulse 90  Temp(Src) 97.3 F (36.3 C)  Resp 16  SpO2 100% Pateitn with severe wheezing and work of breathing. The patient will get hour long neb. He has agreed to a blood transfusion. Heis a hospice patient and Dr. Doylene Canard, his pcp, will see the  patient here in the ED.  Patient has agreed to admission. Care manager Hinton Dyer has been in contact with Hospice who states that his needs are "beyond their capabilities," and encouraged patient to come in to the ED.  Pt stable in ED with no significant deterioration in condition.    Patient   Margarita Mail, PA-C 05/13/14 1100

## 2014-05-07 NOTE — ED Notes (Signed)
Dr. Doylene Canard notified pt's cardiac monitoring d/ced and bed request changed; no further orders at this time.

## 2014-05-07 NOTE — ED Notes (Addendum)
Dr. Loetta Rough updated on POC and pt's wish to stay in hospital. Bed request can be changed to med/surg. Kay in flow contacted and updated on change in bed status.

## 2014-05-07 NOTE — ED Notes (Signed)
Pt removed from monitor and stickers removed per pt. Family in tow. No questions from RN on 4 N.

## 2014-05-07 NOTE — ED Notes (Signed)
Pt is complaining of difficulty breathing.

## 2014-05-07 NOTE — ED Notes (Signed)
Dr. Doree Albee at bedside talking with family and pt.

## 2014-05-08 MED ORDER — PHYTONADIONE 5 MG PO TABS
10.0000 mg | ORAL_TABLET | Freq: Once | ORAL | Status: AC
  Administered 2014-05-08: 10 mg via ORAL
  Filled 2014-05-08: qty 2

## 2014-05-08 MED ORDER — IPRATROPIUM-ALBUTEROL 0.5-2.5 (3) MG/3ML IN SOLN
3.0000 mL | RESPIRATORY_TRACT | Status: DC
  Administered 2014-05-08 – 2014-05-09 (×6): 3 mL via RESPIRATORY_TRACT
  Filled 2014-05-08 (×7): qty 3

## 2014-05-08 MED ORDER — FUROSEMIDE 10 MG/ML IJ SOLN
40.0000 mg | Freq: Two times a day (BID) | INTRAMUSCULAR | Status: AC
  Administered 2014-05-08 (×2): 40 mg via INTRAVENOUS
  Filled 2014-05-08 (×2): qty 4

## 2014-05-08 MED ORDER — LEVOFLOXACIN IN D5W 500 MG/100ML IV SOLN
500.0000 mg | INTRAVENOUS | Status: DC
  Administered 2014-05-08 – 2014-05-09 (×2): 500 mg via INTRAVENOUS
  Filled 2014-05-08 (×2): qty 100

## 2014-05-08 MED ORDER — LORAZEPAM 2 MG/ML IJ SOLN: 1.0000 mg | Freq: Once | INTRAMUSCULAR | Status: DC

## 2014-05-08 MED ORDER — IPRATROPIUM-ALBUTEROL 0.5-2.5 (3) MG/3ML IN SOLN
3.0000 mL | RESPIRATORY_TRACT | Status: DC | PRN
  Administered 2014-05-08: 3 mL via RESPIRATORY_TRACT

## 2014-05-08 MED ORDER — IPRATROPIUM-ALBUTEROL 0.5-2.5 (3) MG/3ML IN SOLN
RESPIRATORY_TRACT | Status: AC
  Filled 2014-05-08: qty 3

## 2014-05-08 NOTE — Progress Notes (Signed)
Family requests to speak to Dr. Doylene Canard r/t Levaquin order. MD paged and on the phone with family. Daughter spoke to MD.   Ave Filter, RN

## 2014-05-08 NOTE — Progress Notes (Signed)
Was called by the nurse to indicate that the patient was wheezing. They were not able to reach Dr. Doylene Canard during the night via the answering service. Duonebs scheduled and as needed ordered.  Coye Dawood A, MD 05/08/2014, 5:59 AM

## 2014-05-08 NOTE — Progress Notes (Signed)
UR complete.  Korey Arroyo RN, MSN 

## 2014-05-08 NOTE — Progress Notes (Signed)
Offered patient ativan per order. Wife at bedside and patient refused. Will reassess. PIV removed per patient/family requested. Patient resting comfortably in bed. No distress noted.  Ave Filter, RN

## 2014-05-08 NOTE — Progress Notes (Signed)
Talked to Attending MD, patient is to be discharged home with hospice care tomorrow; Lorriane Shire with [facility] called and updated- they will have a nurse available to see the patient tomorrow after discharge and DC summary to be faxed to [phone] for resumption of services; Aneta Mins [phone]

## 2014-05-08 NOTE — Progress Notes (Signed)
Received patient from ED to [place] room 13 at 2300.  Lab called about blood transfusion order family states they spoke with MD and patient will be on comfort care with Hospice involvement.  Wife at the bedside with patient.  Patient and wife both refused medication ordered for nausea/vomitting this evening.  Currently on Morphine drip 1 mg/hr for pain management.

## 2014-05-08 NOTE — Progress Notes (Signed)
Patient had concerns about Morphine medication, that it may be causing him to itch refuses anything for anti-itch at this time wants to stop Morphine.  Dr. Doylene Canard was notified and gave order to discontinue medication at this time and he will be making rounds to address patient and wife concerns.

## 2014-05-08 NOTE — Progress Notes (Signed)
Patient has difficulty with IV insertion, per previous nurse. Looked at arm to see if I could find an IV. Not able to spot area to place IV. Per wife, she does not want IV insertion from nurses on floor. Notified IV team.

## 2014-05-08 NOTE — Progress Notes (Signed)
Nutrition Brief Note  Patient identified due to Low Braden Score  Wt Readings from Last 15 Encounters:  05/06/14 190 lb (86.183 kg)  03/28/14 190 lb (86.183 kg)  11/02/12 180 lb (81.647 kg)   Patient has a Body Mass Index of 23.7 kg/(m^2).  Patient meets criteria for Normal Weight based on current BMI.   Pt is comfort care and is NPO. Pt states he is going home today and he denies any nutritional needs at this time. Encouraged pt request rerun visit of nutrition staff via RN if needed. Labs and medications reviewed.   No nutrition interventions warranted at this time. If nutrition issues arise, please consult RD.   Pryor Ochoa RD, LDN Inpatient Clinical Dietitian Pager: [phone] After Hours Pager: [phone]

## 2014-05-08 NOTE — Progress Notes (Signed)
Chaplain initiated visit with pt and family. Chaplain introduced herself. Pt says he is ready to go home. Pt did not need chaplain services at this time.  05/08/14 1000  Clinical Encounter Type  Visited With Patient and family together  Visit Type Initial  Ailynn Gow, Barbette Hair, Chaplain 05/08/2014 10:04 AM

## 2014-05-08 NOTE — Progress Notes (Signed)
Talked to patient's spouse and daughter at bedside; patient is followed by [facility] [phone]) and would like for him to return home at discharge; [facility] called talked to Dominica- she is aware of possible discharge home today and they have the staff to care for the patient at home today/ at discharge DC orders to be faxed to [phone]; Message left with Attending MD concerning DCP; Aneta Mins [phone]

## 2014-05-08 NOTE — Progress Notes (Signed)
Ref: [name], MD   Subjective:  Mild fever. Itching from morphine use is better with discontinuing the drip. Respirations improved.  Objective:  Vital Signs in the last 24 hours: Temp:  [97.3 F (36.3 C)-99 F (37.2 C)] 99 F (37.2 C) (10/22 0832) Pulse Rate:  [83-113] 83 (10/22 0832) Cardiac Rhythm:  [-]  Resp:  [12-20] 12 (10/22 0832) BP: (109-165)/(41-73) 120/73 mmHg (10/22 0832) SpO2:  [92 %-100 %] 92 % (10/22 0832)  Physical Exam: BP Readings from Last 1 Encounters:  05/08/14 120/73    Wt Readings from Last 1 Encounters:  05/06/14 86.183 kg (190 lb)    Weight change:   HEENT: [place]/AT, Eyes-Brown, PERL, EOMI, Conjunctiva-Pale, Sclera-Non-icteric Neck: No JVD, No bruit, Trachea midline. Lungs:  Mild wheezing, Bilateral. Cardiac:  Regular rhythm, normal S1 and S2, S3.  Abdomen:  Soft, non-tender. Extremities:  2-3 + edema present. No cyanosis. No clubbing. CNS: AxOx3, Cranial nerves grossly intact, moves all 4 extremities. Right handed. Skin: Warm and dry. Few lower leg blisters.   Intake/Output from previous day:      Lab Results: BMET    Component Value Date/Time   NA 129* 05/07/2014 1743   NA 127* 05/06/2014 1516   NA 136* 03/29/2014 0615   K 4.3 05/07/2014 1743   K 3.9 05/06/2014 1516   K 3.8 03/29/2014 0615   CL 94* 05/07/2014 1743   CL 92* 05/06/2014 1516   CL 102 03/29/2014 0615   CO2 16* 05/07/2014 1743   CO2 18* 05/06/2014 1516   CO2 23 03/29/2014 0615   GLUCOSE 100* 05/07/2014 1743   GLUCOSE 88 05/06/2014 1516   GLUCOSE 85 03/29/2014 0615   BUN 17 05/07/2014 1743   BUN 12 05/06/2014 1516   BUN 11 03/29/2014 0615   CREATININE 0.99 05/07/2014 1743   CREATININE 0.81 05/06/2014 1516   CREATININE 0.72 03/29/2014 0615   CALCIUM 8.8 05/07/2014 1743   CALCIUM 8.4 05/06/2014 1516   CALCIUM 8.4 03/29/2014 0615   GFRNONAA 78* 05/07/2014 1743   GFRNONAA 84* 05/06/2014 1516   GFRNONAA 88* 03/29/2014 0615   GFRAA 90* 05/07/2014 1743   GFRAA >90  05/06/2014 1516   GFRAA >90 03/29/2014 0615   CBC    Component Value Date/Time   WBC 7.3 05/07/2014 1743   RBC 1.69* 05/07/2014 1743   RBC 2.25* 03/28/2014 1859   HGB 4.8* 05/07/2014 1743   HCT 14.4* 05/07/2014 1743   PLT 105* 05/07/2014 1743   MCV 85.2 05/07/2014 1743   MCH 28.4 05/07/2014 1743   MCHC 33.3 05/07/2014 1743   RDW 18.9* 05/07/2014 1743   LYMPHSABS 1.1 05/06/2014 1516   MONOABS 0.7 05/06/2014 1516   EOSABS 0.1 05/06/2014 1516   BASOSABS 0.0 05/06/2014 1516   HEPATIC Function Panel  Recent Labs  03/29/14 0615 05/06/14 1516  PROT 7.0 7.4   HEMOGLOBIN A1C No components found with this basename: HGA1C,  MPG   CARDIAC ENZYMES Lab Results  Component Value Date   TROPONINI <0.30 02/07/2013   BNP  Recent Labs  05/06/14 1516 05/07/14 1743  PROBNP 123.2 469.4*   TSH No results found for this basename: TSH,  in the last 8760 hours CHOLESTEROL No results found for this basename: CHOL,  in the last 8760 hours  Scheduled Meds: . sodium chloride   Intravenous Once  . furosemide  40 mg Intravenous Q12H  . ipratropium-albuterol  3 mL Nebulization Q4H  . levofloxacin (LEVAQUIN) IV  500 mg Intravenous Q24H  . ondansetron (  ZOFRAN) IV  4 mg Intravenous Once  . sodium chloride  3 mL Intravenous Q12H  . sodium chloride  3 mL Intravenous Q12H   Continuous Infusions:  PRN Meds:.sodium chloride, sodium chloride, ipratropium-albuterol, LORazepam, morphine injection, ondansetron (ZOFRAN) IV, ondansetron, sodium chloride, sodium chloride  Assessment/Plan: Severe Anemia  Acute on chronic left heart systolic failure  Possible pneumonia  CA prostate  Alcoholic Liver cirrhosis with abnormal liver function test  Chronic alcohol abuse  Hyponatremia  Thrombocytopenia  H/O stroke/TIA  Lasix and Levaquin.     LOS: 1 day    Dixie Dials  MD  05/08/2014, 8:57 AM

## 2014-05-09 LAB — CBC WITH DIFFERENTIAL/PLATELET
BASOS PCT: 0 % (ref 0–1)
Basophils Absolute: 0 10*3/uL (ref 0.0–0.1)
EOS PCT: 1 % (ref 0–5)
Eosinophils Absolute: 0 10*3/uL (ref 0.0–0.7)
HEMATOCRIT: 12.1 % — AB (ref 39.0–52.0)
HEMOGLOBIN: 4 g/dL — AB (ref 13.0–17.0)
LYMPHS ABS: 1.2 10*3/uL (ref 0.7–4.0)
Lymphocytes Relative: 25 % (ref 12–46)
MCH: 29 pg (ref 26.0–34.0)
MCHC: 33.1 g/dL (ref 30.0–36.0)
MCV: 87.7 fL (ref 78.0–100.0)
MONOS PCT: 18 % — AB (ref 3–12)
Monocytes Absolute: 0.9 10*3/uL (ref 0.1–1.0)
NEUTROS ABS: 2.7 10*3/uL (ref 1.7–7.7)
Neutrophils Relative %: 56 % (ref 43–77)
Platelets: 78 10*3/uL — ABNORMAL LOW (ref 150–400)
RBC: 1.38 MIL/uL — AB (ref 4.22–5.81)
RDW: 20.8 % — ABNORMAL HIGH (ref 11.5–15.5)
WBC: 4.8 10*3/uL (ref 4.0–10.5)

## 2014-05-09 LAB — COMPREHENSIVE METABOLIC PANEL
ALK PHOS: 95 U/L (ref 39–117)
ALT: 24 U/L (ref 0–53)
ANION GAP: 12 (ref 5–15)
AST: 63 U/L — ABNORMAL HIGH (ref 0–37)
Albumin: 2 g/dL — ABNORMAL LOW (ref 3.5–5.2)
BILIRUBIN TOTAL: 3.3 mg/dL — AB (ref 0.3–1.2)
BUN: 29 mg/dL — AB (ref 6–23)
CHLORIDE: 99 meq/L (ref 96–112)
CO2: 24 meq/L (ref 19–32)
CREATININE: 1.23 mg/dL (ref 0.50–1.35)
Calcium: 8.9 mg/dL (ref 8.4–10.5)
GFR calc Af Amer: 64 mL/min — ABNORMAL LOW (ref >90)
GFR, EST NON AFRICAN AMERICAN: 55 mL/min — AB (ref >90)
Glucose, Bld: 92 mg/dL (ref 70–99)
POTASSIUM: 4 meq/L (ref 3.7–5.3)
Sodium: 135 mEq/L — ABNORMAL LOW (ref 137–147)
Total Protein: 6.6 g/dL (ref 6.0–8.3)

## 2014-05-09 LAB — PROTIME-INR
INR: 2.51 — ABNORMAL HIGH (ref 0.00–1.49)
PROTHROMBIN TIME: 27.3 s — AB (ref 11.6–15.2)

## 2014-05-09 MED ORDER — FUROSEMIDE 40 MG PO TABS
40.0000 mg | ORAL_TABLET | Freq: Once | ORAL | Status: AC
  Administered 2014-05-09: 40 mg via ORAL
  Filled 2014-05-09: qty 1

## 2014-05-09 MED ORDER — FUROSEMIDE 10 MG/ML IJ SOLN
40.0000 mg | Freq: Two times a day (BID) | INTRAMUSCULAR | Status: DC
  Filled 2014-05-09: qty 4

## 2014-05-09 MED ORDER — FUROSEMIDE 40 MG PO TABS: 40.0000 mg | ORAL_TABLET | Freq: Every day | ORAL | Status: AC

## 2014-05-09 MED ORDER — PHYTONADIONE 5 MG PO TABS: 5.0000 mg | ORAL_TABLET | ORAL | Status: AC

## 2014-05-09 MED ORDER — LEVOFLOXACIN 500 MG PO TABS: 500.0000 mg | ORAL_TABLET | Freq: Every day | ORAL | Status: AC

## 2014-05-09 NOTE — Clinical Documentation Improvement (Signed)
  Patient with prostate cancer, alcoholic cirrhosis, hx GI bleed, admitted with severe anemia after "coughing up blood", Hgb 4.0, transfused. Please clarify Primary Diagnosis and if patient has "acute blood loss anemia" to more clearly reflect severity of illness and risk of mortality. Thank you.  Possible Conditions . Documentation of Anemia should include the type of anemia: --Nutritional --Hemolytic --Aplastic --Due to blood loss --Other (please specify) . Include in documentation if Anemia is due to nutrition or mineral deficits, resulting in a nutritional anemia . Document if the Anemia is due to a neoplasm (primary and/or secondary) . Link any laboratory findings to a related diagnosis (if appropriate) . Document the specific drug if anemia is drug-induced . Document any associated diagnoses/conditions  Thank You,

## 2014-05-09 NOTE — Progress Notes (Signed)
Patient discharging home to today with home hospice as prior to admission; discharge summary faxed to [facility] as requested; Aneta Mins [phone]

## 2014-05-09 NOTE — Progress Notes (Signed)
Patient's consent was seek if it is ok to be transfused today with the Hb of 4.0 or any other blood products but he refused anything like that. He did refuse it in front of family members, the MD and the RN taking care of him for the day.

## 2014-05-09 NOTE — Progress Notes (Signed)
Patient d/c home this afternoon. Instructions given to wife and other family. Instructed to come back or call MD if he changes his mind for the blood transfusion. Other than that all assessments remained unchanged prior to discharged. Family wheeled him down themselves.

## 2014-05-09 NOTE — Progress Notes (Signed)
Noted this am from lab is patient's hemoglobin at 4.0. Will notify on call.

## 2014-05-09 NOTE — Discharge Summary (Signed)
Physician Discharge Summary  Patient ID: Devin Parker MRN: [id number] DOB/AGE: 1935-05-28 79 y.o.  Admit date: 05/07/2014 Discharge date: 05/09/2014  Admission Diagnoses: Severe Anemia  Acute on chronic left heart systolic failure  Possible pneumonia  CA prostate  Alcoholic Liver cirrhosis with abnormal liver function test  Chronic alcohol abuse  Hyponatremia  Thrombocytopenia  H/O stroke/TIA  Discharge Diagnoses:  Principle Problem: * Severe anemia from chronic blood loss *  Acute on chronic left heart systolic failure  Pneumonia  CA prostate  Alcoholic Liver cirrhosis with abnormal liver function test  Hypoalbuminemia Hyperbilirubinemia Elevated PT/INR Chronic alcohol abuse  Hyponatremia, improving  Thrombocytopenia  H/O stroke/TIA  Discharged Condition: poor  Hospital Course: 79 year old hospice patient with PMH of CVA, prostate CA, HTN and alcohol abuse has recurrent bleed and severe anemia. Patient has refused blood transfusions in the past and today. He also has chronic alcoholic cirrhosis with elevated INR without medications. He also has significant hyponatremia with bilateral leg edema and bilateral pleural effusions and possible air-space disease. Family members and patient are in agreement with comfort care. He could not tolerate morphine drip due to generalized itching. He improved significantly with IV lasix and IV Levaquin use. He refused blood transfusion today also and wants to go home. He will be followed by hospice nurse and by me in 1 week or earlier as needed.  Consults: cardiology  Significant Diagnostic Studies: labs: Hgb of 5.1 on admission and 4.0 today. Elevated liver function test. Improving sodium level to 135 meq to 127 meq on admission.  Treatments: antibiotics: Levaquin  Discharge Exam: Blood pressure 107/49, pulse 88, temperature 98.3 F (36.8 C), temperature source Oral, resp. rate 17, height 6\' 3"  (1.905 m), SpO2  96.00%. Constitutional: WD,averagely nourished. HEENT: [place]/AT, Brown eyes, pale conjunctiva, muddy sclera.  Neck: No JVD at 30 degree angle. Supple, No lymphadenopathy.  [place] : Clearing, bilateral.   Heart: S 1 and S 2 normal. II/VI systolic murmur.   Abdomen: Soft, Non-tender.  Extremities: 1 + edema with chronic venous stasis changes.  CNS: Awake, cranial nerves grossly intact. Moves all 4 extremities.  Skin: Pale, dry.   Disposition: 01-Home or Self Care + hospice care.     Medication List    STOP taking these medications       predniSONE 20 MG tablet  Commonly known as:  DELTASONE      TAKE these medications       acetaminophen 500 MG tablet  Commonly known as:  TYLENOL  Take 500 mg by mouth every 6 (six) hours as needed for moderate pain.     albuterol (2.5 MG/3ML) 0.083% nebulizer solution  Commonly known as:  PROVENTIL  Inhale 3 mLs into the lungs 2 (two) times daily.     cholecalciferol 1000 UNITS tablet  Commonly known as:  VITAMIN D  Take 1,000 Units by mouth daily.     cyanocobalamin 500 MCG tablet  Take 500 mcg by mouth daily. Vitamin B12     ENSURE PLUS Liqd  Take 237 mLs by mouth 2 (two) times daily between meals.     Fish Oil 500 MG Caps  Take 500 mg by mouth 2 (two) times a week.     Garlic 938 MG Tabs  Take 200 mg by mouth 2 (two) times a week.     levofloxacin 500 MG tablet  Commonly known as:  LEVAQUIN  Take 1 tablet (500 mg total) by mouth daily.     multivitamin with  minerals Tabs tablet  Take 1 tablet by mouth daily.     OVER THE COUNTER MEDICATION  Apply 1 application topically daily as needed (puts on chest and neck for wheezing help).     OXYGEN  Inhale 2 L into the lungs as needed (doesn't use daily).      Furosemide 40 mg. One daily. Phytonadione 5 mg. Once a week.     Follow-up Information   Follow up with [facility], MD. Schedule an appointment as soon as possible for a visit in 1 week.   Specialty:  Cardiology    Contact information:   [place] [address] [phone]       Signed: [name] 05/09/2014, 11:49 AM

## 2014-05-09 NOTE — Progress Notes (Signed)
Notified by lab, that patient's hemoglobin was 4. Patient has refused blood transfusions prior, expected result. Will still notify on call for [facility] MD. Spoke with operator. Will notify physician when office opens.

## 2014-05-11 LAB — TYPE AND SCREEN
ABO/RH(D): B POS
Antibody Screen: POSITIVE
DAT, IgG: NEGATIVE
UNIT DIVISION: 0
Unit division: 0

## 2014-05-15 NOTE — ED Provider Notes (Signed)
Medical screening examination/treatment/procedure(s) were conducted as a shared visit with non-physician practitioner(s) or resident and myself. I personally evaluated the patient during the encounter and agree with the findings.  I have personally reviewed any xrays and/ or EKG's with the provider and I agree with interpretation.  Patient with significant medical history including stroke, pancytopenia, intracerebral hemorrhage, prostate cancer, hospice patient presents with worsening shortness of breath wheezing and persistent anemia. Patient's had recent hemoglobin on 5 and initially refusing blood transfusion however his changes minor like blood transfusion to feel better and further treatment of his lungs. Patient denies lung disease diagnosis however has smoked for years. On exam patient has tachypnea, expiratory wheezing bilateral, abdomen soft nontender, dry mucous membranes, pale. Plan for blood transfusion, continuous neb, steroids, blood work and admission for supportive treatment. Physician coming into the emergency department to discuss extent of treatment and palliative care. Holding blood transfusion at this time.  Symptomatic anemia, acute dyspnea, wheezing, GI bleeding   Mariea Clonts, MD 05/15/14 [phone]

## 2014-06-15 ENCOUNTER — Emergency Department (HOSPITAL_COMMUNITY): Payer: Medicare Other

## 2014-06-15 ENCOUNTER — Encounter (HOSPITAL_COMMUNITY): Payer: Self-pay

## 2014-06-15 ENCOUNTER — Inpatient Hospital Stay (HOSPITAL_COMMUNITY)
Admission: EM | Admit: 2014-06-15 | Discharge: 2014-07-18 | DRG: 811 | Disposition: E | Payer: Medicare Other | Attending: Cardiovascular Disease | Admitting: Cardiovascular Disease

## 2014-06-15 DIAGNOSIS — D649 Anemia, unspecified: Secondary | ICD-10-CM | POA: Diagnosis present

## 2014-06-15 DIAGNOSIS — R531 Weakness: Secondary | ICD-10-CM | POA: Diagnosis present

## 2014-06-15 DIAGNOSIS — D500 Iron deficiency anemia secondary to blood loss (chronic): Secondary | ICD-10-CM | POA: Diagnosis present

## 2014-06-15 DIAGNOSIS — Z9981 Dependence on supplemental oxygen: Secondary | ICD-10-CM | POA: Diagnosis not present

## 2014-06-15 DIAGNOSIS — J189 Pneumonia, unspecified organism: Secondary | ICD-10-CM | POA: Diagnosis present

## 2014-06-15 DIAGNOSIS — C61 Malignant neoplasm of prostate: Secondary | ICD-10-CM | POA: Diagnosis present

## 2014-06-15 DIAGNOSIS — Z515 Encounter for palliative care: Secondary | ICD-10-CM

## 2014-06-15 DIAGNOSIS — I5022 Chronic systolic (congestive) heart failure: Secondary | ICD-10-CM | POA: Diagnosis present

## 2014-06-15 DIAGNOSIS — C787 Secondary malignant neoplasm of liver and intrahepatic bile duct: Secondary | ICD-10-CM | POA: Insufficient documentation

## 2014-06-15 DIAGNOSIS — Z8673 Personal history of transient ischemic attack (TIA), and cerebral infarction without residual deficits: Secondary | ICD-10-CM | POA: Diagnosis not present

## 2014-06-15 DIAGNOSIS — F1010 Alcohol abuse, uncomplicated: Secondary | ICD-10-CM | POA: Diagnosis present

## 2014-06-15 DIAGNOSIS — N179 Acute kidney failure, unspecified: Secondary | ICD-10-CM | POA: Diagnosis present

## 2014-06-15 DIAGNOSIS — K7030 Alcoholic cirrhosis of liver without ascites: Secondary | ICD-10-CM | POA: Diagnosis present

## 2014-06-15 DIAGNOSIS — D62 Acute posthemorrhagic anemia: Secondary | ICD-10-CM | POA: Diagnosis present

## 2014-06-15 DIAGNOSIS — I110 Hypertensive heart disease with heart failure: Secondary | ICD-10-CM | POA: Diagnosis present

## 2014-06-15 DIAGNOSIS — Z66 Do not resuscitate: Secondary | ICD-10-CM | POA: Diagnosis present

## 2014-06-15 DIAGNOSIS — D696 Thrombocytopenia, unspecified: Secondary | ICD-10-CM | POA: Diagnosis present

## 2014-06-15 DIAGNOSIS — K922 Gastrointestinal hemorrhage, unspecified: Secondary | ICD-10-CM | POA: Diagnosis present

## 2014-06-15 DIAGNOSIS — R600 Localized edema: Secondary | ICD-10-CM

## 2014-06-15 DIAGNOSIS — Z87891 Personal history of nicotine dependence: Secondary | ICD-10-CM

## 2014-06-15 DIAGNOSIS — R0602 Shortness of breath: Secondary | ICD-10-CM

## 2014-06-15 HISTORY — DX: Secondary malignant neoplasm of liver and intrahepatic bile duct: C78.7

## 2014-06-15 LAB — PROTIME-INR
INR: 2.44 — ABNORMAL HIGH (ref 0.00–1.49)
Prothrombin Time: 26.7 seconds — ABNORMAL HIGH (ref 11.6–15.2)

## 2014-06-15 LAB — CBC
HCT: 13.3 % — ABNORMAL LOW (ref 39.0–52.0)
HEMOGLOBIN: 4.4 g/dL — AB (ref 13.0–17.0)
MCH: 27.7 pg (ref 26.0–34.0)
MCHC: 33.1 g/dL (ref 30.0–36.0)
MCV: 83.6 fL (ref 78.0–100.0)
PLATELETS: 73 10*3/uL — AB (ref 150–400)
RBC: 1.59 MIL/uL — ABNORMAL LOW (ref 4.22–5.81)
RDW: 25.5 % — ABNORMAL HIGH (ref 11.5–15.5)
WBC: 4.6 10*3/uL (ref 4.0–10.5)

## 2014-06-15 LAB — ABO/RH: ABO/RH(D): B POS

## 2014-06-15 LAB — COMPREHENSIVE METABOLIC PANEL
ALT: 15 U/L (ref 0–53)
AST: 40 U/L — ABNORMAL HIGH (ref 0–37)
Albumin: 2 g/dL — ABNORMAL LOW (ref 3.5–5.2)
Alkaline Phosphatase: 78 U/L (ref 39–117)
Anion gap: 14 (ref 5–15)
BUN: 19 mg/dL (ref 6–23)
CALCIUM: 8.3 mg/dL — AB (ref 8.4–10.5)
CO2: 23 meq/L (ref 19–32)
CREATININE: 1.01 mg/dL (ref 0.50–1.35)
Chloride: 99 mEq/L (ref 96–112)
GFR, EST AFRICAN AMERICAN: 81 mL/min — AB (ref >90)
GFR, EST NON AFRICAN AMERICAN: 70 mL/min — AB (ref >90)
GLUCOSE: 102 mg/dL — AB (ref 70–99)
Potassium: 3.5 mEq/L — ABNORMAL LOW (ref 3.7–5.3)
Sodium: 136 mEq/L — ABNORMAL LOW (ref 137–147)
TOTAL PROTEIN: 6.7 g/dL (ref 6.0–8.3)
Total Bilirubin: 3.5 mg/dL — ABNORMAL HIGH (ref 0.3–1.2)

## 2014-06-15 LAB — TYPE AND SCREEN
ABO/RH(D): B POS
ANTIBODY SCREEN: NEGATIVE

## 2014-06-15 LAB — OCCULT BLOOD X 1 CARD TO LAB, STOOL: Fecal Occult Bld: POSITIVE — AB

## 2014-06-15 MED ORDER — ONDANSETRON HCL 4 MG PO TABS: 4.0000 mg | ORAL_TABLET | Freq: Four times a day (QID) | ORAL | Status: DC | PRN

## 2014-06-15 MED ORDER — SODIUM CHLORIDE 0.9 % IV SOLN: 10.0000 ug/h | INTRAVENOUS | Status: DC

## 2014-06-15 MED ORDER — ONDANSETRON HCL 4 MG/2ML IJ SOLN
4.0000 mg | Freq: Once | INTRAMUSCULAR | Status: AC
  Administered 2014-06-15: 4 mg via INTRAVENOUS
  Filled 2014-06-15: qty 2

## 2014-06-15 MED ORDER — LORAZEPAM 2 MG/ML IJ SOLN
0.5000 mg | Freq: Four times a day (QID) | INTRAMUSCULAR | Status: DC | PRN
  Administered 2014-06-17: 0.5 mg via INTRAVENOUS
  Filled 2014-06-15: qty 1

## 2014-06-15 MED ORDER — ACETAMINOPHEN 500 MG PO TABS: 500.0000 mg | ORAL_TABLET | Freq: Four times a day (QID) | ORAL | Status: DC | PRN

## 2014-06-15 MED ORDER — LORAZEPAM 2 MG/ML IJ SOLN: 0.5000 mg | INTRAMUSCULAR | Status: DC | PRN

## 2014-06-15 MED ORDER — SODIUM CHLORIDE 0.9 % IJ SOLN
3.0000 mL | Freq: Two times a day (BID) | INTRAMUSCULAR | Status: DC
Start: 2014-06-15 — End: 2014-06-18

## 2014-06-15 MED ORDER — FUROSEMIDE 40 MG PO TABS
40.0000 mg | ORAL_TABLET | Freq: Every day | ORAL | Status: DC
  Filled 2014-06-15 (×3): qty 1

## 2014-06-15 MED ORDER — SODIUM CHLORIDE 0.9 % IJ SOLN: 3.0000 mL | INTRAMUSCULAR | Status: DC | PRN

## 2014-06-15 MED ORDER — LORAZEPAM BOLUS VIA INFUSION: 0.5000 mg | INTRAVENOUS | Status: DC | PRN

## 2014-06-15 MED ORDER — ONDANSETRON HCL 4 MG/2ML IJ SOLN: 4.0000 mg | Freq: Four times a day (QID) | INTRAMUSCULAR | Status: DC | PRN

## 2014-06-15 MED ORDER — ADULT MULTIVITAMIN W/MINERALS CH
1.0000 | ORAL_TABLET | Freq: Every day | ORAL | Status: DC
  Filled 2014-06-15 (×3): qty 1

## 2014-06-15 MED ORDER — ALBUTEROL SULFATE (2.5 MG/3ML) 0.083% IN NEBU
3.0000 mL | INHALATION_SOLUTION | Freq: Two times a day (BID) | RESPIRATORY_TRACT | Status: DC
  Administered 2014-06-15 – 2014-06-16 (×2): 3 mL via RESPIRATORY_TRACT
  Filled 2014-06-15 (×5): qty 3

## 2014-06-15 MED ORDER — SODIUM CHLORIDE 0.9 % IV SOLN: 250.0000 mL | INTRAVENOUS | Status: DC | PRN

## 2014-06-15 MED ORDER — VITAMIN B-1 100 MG PO TABS
100.0000 mg | ORAL_TABLET | Freq: Every day | ORAL | Status: DC
  Filled 2014-06-15 (×3): qty 1

## 2014-06-15 MED ORDER — PHYTONADIONE 5 MG PO TABS
10.0000 mg | ORAL_TABLET | Freq: Once | ORAL | Status: DC
  Filled 2014-06-15: qty 2

## 2014-06-15 MED ORDER — HYDROMORPHONE HCL PF 10 MG/ML IJ SOLN
1.0000 mg/h | INTRAMUSCULAR | Status: DC
Start: 2014-06-15 — End: 2014-06-18
  Administered 2014-06-15 – 2014-06-17 (×2): 1 mg/h via INTRAVENOUS
  Filled 2014-06-15 (×2): qty 2.5

## 2014-06-15 MED ORDER — FUROSEMIDE 10 MG/ML IJ SOLN
40.0000 mg | Freq: Once | INTRAMUSCULAR | Status: AC
  Administered 2014-06-15: 40 mg via INTRAVENOUS
  Filled 2014-06-15: qty 4

## 2014-06-15 MED ORDER — ALBUTEROL SULFATE (2.5 MG/3ML) 0.083% IN NEBU: 2.5000 mg | INHALATION_SOLUTION | RESPIRATORY_TRACT | Status: DC | PRN

## 2014-06-15 NOTE — ED Notes (Signed)
Dr. Doylene Canard said earlier when he was here that he is aware of this pt's. Anemia and will return to write orders/give plan when all results are back.  I shall notify him now they are back.

## 2014-06-15 NOTE — ED Notes (Signed)
I have just phoned report to [facility] [address].  Will transport shortly.

## 2014-06-15 NOTE — ED Provider Notes (Signed)
CSN: [id number]     Arrival date & time 06/16/2014  2035 History   First MD Initiated Contact with Patient 05/29/2014 [phone]     Chief Complaint  Patient presents with  . Melena      HPI  Expand All Collapse All   He comes to [place] from home. His wife phoned EMS r/t pt. Having melena stools since yesterday (x 4). He is a hospice pt. Who has chosen not to actively treat his prostate cancer .He is oriented to all except time; and is in no distress.      Past Medical History  Diagnosis Date  . CVA (cerebral infarction)   . TIA (transient ischemic attack)   . Hypertension   . Pancytopenia   . Hospice care patient     on home hospice  . ICH (intracerebral hemorrhage) 03/2014    s/p fall  . Prostate cancer   . Bronchospasm     has home O2 N/C and nebulizer  . Liver metastases    History reviewed. No pertinent past surgical history. History reviewed. No pertinent family history. History  Substance Use Topics  . Smoking status: Former Research scientist (life sciences)  . Smokeless tobacco: Not on file  . Alcohol Use: No    Review of Systems  All other systems reviewed and are negative.     Allergies  Levaquin; Morphine and related; and Penicillins  Home Medications   Prior to Admission medications   Medication Sig Start Date End Date Taking? Authorizing Provider  acetaminophen (TYLENOL) 500 MG tablet Take 500 mg by mouth every 6 (six) hours as needed for moderate pain.   Yes Historical Provider, MD  albuterol (PROVENTIL) (2.5 MG/3ML) 0.083% nebulizer solution Inhale 3 mLs into the lungs 2 (two) times daily. 05/02/14  Yes Historical Provider, MD  cholecalciferol (VITAMIN D) 1000 UNITS tablet Take 1,000 Units by mouth every other day.    Yes Historical Provider, MD  cyanocobalamin 500 MCG tablet Take 500 mcg by mouth every other day. Vitamin B12   Yes Historical Provider, MD  ENSURE PLUS (ENSURE PLUS) LIQD Take 237 mLs by mouth 2 (two) times daily between meals.   Yes Historical Provider, MD  furosemide  (LASIX) 40 MG tablet Take 1 tablet (40 mg total) by mouth daily. 05/09/14  Yes [name], MD  Garlic 163 MG TABS Take 200 mg by mouth 2 (two) times a week.   Yes Historical Provider, MD  Multiple Vitamin (MULTIVITAMIN WITH MINERALS) TABS tablet Take 1 tablet by mouth daily.    Yes Historical Provider, MD  Omega-3 Fatty Acids (FISH OIL) 500 MG CAPS Take 500 mg by mouth 2 (two) times a week.   Yes Historical Provider, MD  OVER THE COUNTER MEDICATION Apply 1 application topically daily as needed (puts on chest and neck for wheezing help).   Yes Historical Provider, MD  OXYGEN Inhale 2 L into the lungs as needed (doesn't use daily).   Yes Historical Provider, MD  phytonadione (VITAMIN K) 5 MG tablet Take 1 tablet (5 mg total) by mouth once a week. Patient taking differently: Take 5 mg by mouth once a week. Takes on Sunday's 05/09/14  Yes [name], MD  levofloxacin (LEVAQUIN) 500 MG tablet Take 1 tablet (500 mg total) by mouth daily. Patient not taking: Reported on 06/05/2014 05/09/14   [name], MD   BP 77/39 mmHg  Pulse 105  Temp(Src) 98.6 F (37 C) (Axillary)  Resp 10  Ht 6\' 5"  (1.956 m)  Wt 205 lb (92.987 kg)  BMI 24.30 kg/m2  SpO2 99% Physical Exam  Constitutional: He appears well-developed and well-nourished. He appears lethargic. He has a sickly appearance. No distress.  HENT:  Head: Normocephalic and atraumatic.  Eyes: Pupils are equal, round, and reactive to light.  Neck: Normal range of motion.  Cardiovascular: Normal rate and intact distal pulses.   Pulmonary/Chest: No respiratory distress.  Abdominal: Normal appearance. He exhibits no distension.  Musculoskeletal: Normal range of motion.  Neurological: He appears lethargic. No cranial nerve deficit.  Skin: Skin is warm and dry. No rash noted.  Nursing note and vitals reviewed.   ED Course  Procedures (including critical care time) Labs Review Labs Reviewed  CBC - Abnormal; Notable for the following:     RBC 1.59 (*)    Hemoglobin 4.4 (*)    HCT 13.3 (*)    RDW 25.5 (*)    Platelets 73 (*)    All other components within normal limits  COMPREHENSIVE METABOLIC PANEL - Abnormal; Notable for the following:    Sodium 136 (*)    Potassium 3.5 (*)    Glucose, Bld 102 (*)    Calcium 8.3 (*)    Albumin 2.0 (*)    AST 40 (*)    Total Bilirubin 3.5 (*)    GFR calc non Af Amer 70 (*)    GFR calc Af Amer 81 (*)    All other components within normal limits  PROTIME-INR - Abnormal; Notable for the following:    Prothrombin Time 26.7 (*)    INR 2.44 (*)    All other components within normal limits  BASIC METABOLIC PANEL - Abnormal; Notable for the following:    Sodium 133 (*)    BUN 26 (*)    Creatinine, Ser 1.73 (*)    GFR calc non Af Amer 36 (*)    GFR calc Af Amer 42 (*)    All other components within normal limits  CBC - Abnormal; Notable for the following:    RBC 1.66 (*)    Hemoglobin 4.2 (*)    HCT 13.8 (*)    MCH 25.3 (*)    RDW 25.0 (*)    Platelets 80 (*)    All other components within normal limits  PROTIME-INR - Abnormal; Notable for the following:    Prothrombin Time 24.3 (*)    INR 2.16 (*)    All other components within normal limits  OCCULT BLOOD X 1 CARD TO LAB, STOOL - Abnormal; Notable for the following:    Fecal Occult Bld POSITIVE (*)    All other components within normal limits  PROTIME-INR - Abnormal; Notable for the following:    Prothrombin Time 25.8 (*)    INR 2.34 (*)    All other components within normal limits  BASIC METABOLIC PANEL - Abnormal; Notable for the following:    BUN 33 (*)    Creatinine, Ser 3.02 (*)    GFR calc non Af Amer 19 (*)    GFR calc Af Amer 21 (*)    Anion gap 18 (*)    All other components within normal limits  POC OCCULT BLOOD, ED  TYPE AND SCREEN  ABO/RH   Dr. Doylene Canard came to the ED and spoke with the patient and the wife.  Have decided to admit for comfort care. Imaging Review No results found.    MDM   Final  diagnoses:          Devin Parker  Devin Goo, MD 06/26/14 2205

## 2014-06-15 NOTE — ED Notes (Signed)
He continues to be in no distress.  Dilaudid drip begun, for which he and his family thank me.  He denies pain, however he looks slightly tense.

## 2014-06-15 NOTE — ED Notes (Signed)
He comes to [place] from home. His wife phoned EMS r/t pt. Having melena stools since yesterday (x 4).  He is a hospice pt. Who has chosen not to actively treat his prostate cancer with mets.  He also specifically tells me he by no means wishes to receive a blood transfusion.  He is oriented to all except time; and is in no distress.

## 2014-06-15 NOTE — ED Notes (Signed)
Dr. Doylene Canard is speaking with Dr. Audie Pinto as I write this (he is here in our department.)

## 2014-06-15 NOTE — H&P (Signed)
Referring Physician:  [name] is an 79 y.o. male.                       Chief Complaint: Weakness  HPI: 79 year old hospice patient with PMH of CVA, prostate CA, HTN and alcohol abuse has recurrent bleed has severe anemia. Patient has refused blood transfusions in the past and today. He also has chronic alcoholic cirrhosis with elevated INR without medications. He also has significant bilateral leg edema and small left pleural effusions and possible air-space disease. Wife and patient are in agreement with comfort care. No cough, fever or elevated WBC count. Wife noticed recurrent GI bleed for long time.  Past Medical History  Diagnosis Date  . CVA (cerebral infarction)   . TIA (transient ischemic attack)   . Hypertension   . Pancytopenia   . Hospice care patient     on home hospice  . ICH (intracerebral hemorrhage) 03/2014    s/p fall  . Prostate cancer   . Bronchospasm     has home O2 N/C and nebulizer  . Liver metastases       No past surgical history on file.  No family history on file. Social History:  reports that he has quit smoking. He does not have any smokeless tobacco history on file. He reports that he does not drink alcohol or use illicit drugs.  Allergies:  Allergies  Allergen Reactions  . Levaquin [Levofloxacin] Nausea And Vomiting    Made patient actively vomit   . Morphine And Related Itching  . Penicillins Rash     (Not in a hospital admission)  Results for orders placed or performed during the hospital encounter of 06/09/2014 (from the past 48 hour(s))  CBC     Status: Abnormal   Collection Time: 06/01/2014 10:40 AM  Result Value Ref Range   WBC 4.6 4.0 - 10.5 K/uL   RBC 1.59 (L) 4.22 - 5.81 MIL/uL   Hemoglobin 4.4 (LL) 13.0 - 17.0 g/dL    Comment: REPEATED TO VERIFY CRITICAL RESULT CALLED TO, READ BACK BY AND VERIFIED WITH: BRITTANY DUNCAN,RN [id number] @ 1107 BY J SCOTTON    HCT 13.3 (L) 39.0 - 52.0 %   MCV 83.6 78.0 - 100.0 fL   MCH  27.7 26.0 - 34.0 pg   MCHC 33.1 30.0 - 36.0 g/dL   RDW 25.5 (H) 11.5 - 15.5 %   Platelets 73 (L) 150 - 400 K/uL    Comment: REPEATED TO VERIFY SPECIMEN CHECKED FOR CLOTS PLATELET COUNT CONFIRMED BY SMEAR   Comprehensive metabolic panel     Status: Abnormal   Collection Time: 05/21/2014 10:40 AM  Result Value Ref Range   Sodium 136 (L) 137 - 147 mEq/L   Potassium 3.5 (L) 3.7 - 5.3 mEq/L   Chloride 99 96 - 112 mEq/L   CO2 23 19 - 32 mEq/L   Glucose, Bld 102 (H) 70 - 99 mg/dL   BUN 19 6 - 23 mg/dL   Creatinine, Ser 1.01 0.50 - 1.35 mg/dL   Calcium 8.3 (L) 8.4 - 10.5 mg/dL   Total Protein 6.7 6.0 - 8.3 g/dL   Albumin 2.0 (L) 3.5 - 5.2 g/dL   AST 40 (H) 0 - 37 U/L   ALT 15 0 - 53 U/L   Alkaline Phosphatase 78 39 - 117 U/L   Total Bilirubin 3.5 (H) 0.3 - 1.2 mg/dL   GFR calc non Af Amer 70 (L) >90 mL/min  GFR calc Af Amer 81 (L) >90 mL/min    Comment: (NOTE) The eGFR has been calculated using the CKD EPI equation. This calculation has not been validated in all clinical situations. eGFR's persistently <90 mL/min signify possible Chronic Kidney Disease.    Anion gap 14 5 - 15  Protime-INR (if patient is taking Coumadin)     Status: Abnormal   Collection Time: 06/05/2014 10:40 AM  Result Value Ref Range   Prothrombin Time 26.7 (H) 11.6 - 15.2 seconds   INR 2.44 (H) 0.00 - 1.49  Type and screen for Red Blood Exchange     Status: None   Collection Time: 06/12/2014 10:41 AM  Result Value Ref Range   ABO/RH(D) B POS    Antibody Screen NEG    Sample Expiration 06/18/2014   ABO/Rh     Status: None   Collection Time: 05/23/2014 10:41 AM  Result Value Ref Range   ABO/RH(D) B POS    Ct Head Wo Contrast  06/10/2014   CLINICAL DATA:  Metastatic prostate cancer.  EXAM: CT HEAD WITHOUT CONTRAST  TECHNIQUE: Contiguous axial images were obtained from the base of the skull through the vertex without intravenous contrast.  COMPARISON:  CT head 05/06/2014  FINDINGS: Moderate atrophy. Chronic  microvascular ischemic changes in the white matter. Chronic infarct left cerebellum is stable. Chronic ischemic changes in the pons stable  Negative for acute infarct.  Negative for hemorrhage or mass.  Calvarium intact.  IMPRESSION: Atrophy and chronic ischemic changes are stable. No acute abnormality.   Electronically Signed   By: Franchot Gallo M.D.   On: 05/20/2014 11:23   Dg Chest Portable 1 View  05/25/2014   CLINICAL DATA:  Shortness of breath today.  Ex-smoker.  EXAM: PORTABLE CHEST - 1 VIEW  COMPARISON:  05/07/2014.  FINDINGS: Normal sized heart. Increased left basilar airspace opacity with interval opacity in the left mid lung zone. Minimal right basilar atelectasis. Small left pleural effusion. Unremarkable bones.  IMPRESSION: 1. Increased left lung probable pneumonia or postobstructive changes. 2. Small left pleural effusion. 3. Minimal right basilar atelectasis.   Electronically Signed   By: Enrique Sack M.D.   On: 05/22/2014 11:26    Review Of Systems Constitutional: Negative for fever and chills.  HENT: Negative for congestion and sore throat.  Respiratory: Positive for cough and shortness of breath.  Cardiovascular: Negative for chest pain and positive for leg swelling.  Gastrointestinal: Negative for nausea, vomiting, abdominal pain, diarrhea and constipation.  Genitourinary: Negative for dysuria and frequency.  Skin: Negative for rash.  Neurological: Positive for weakness. Negative for dizziness and headaches.  Psychiatric/Behavioral: Negative for confusion and agitation.   Blood pressure 153/77, pulse 90, temperature 98 F (36.7 C), temperature source Oral, resp. rate 18, SpO2 95 %. Constitutional: WD,averagely nourished and no shortness of breath. HEENT: [place]/AT, Brown eyes, pale conjunctiva, muddy sclera. Tongue mid line and wet. Neck: + JVD at 30 degree angle. Supple, No lymphadenopathy. Lunds : Bilateral basal crackles and some rhonchi. Somewhat tachypneic and  speaking few words. Heart: S 1 and S 2 normal. S 3 present.  Abdomen: Soft, distended and mildly tender. Extremities: 3 + edema with chronic venous stasis changes. CNS: Awake, cranial nerves grossly intact. Moves all 4 extremities. Skin: Pale, dry.  Assessment/Plan Severe Anemia Acute on chronic GI blood loss Chronic left heart systolic failure Possible pneumonia CA prostate with metastasis Alcoholic Liver cirrhosis with abnormal liver function test Chronic alcohol abuse Weakness Thrombocytopenia H/O stroke/TIA  Admit/Comfort  care  [name], MD  05/22/2014, 2:04 PM

## 2014-06-15 NOTE — ED Notes (Signed)
Bed: YT03 Expected date: 05/19/2014 Expected time: 9:20 AM Means of arrival: Ambulance Comments: Tarry stools

## 2014-06-15 NOTE — Progress Notes (Signed)
Pt with resp=8/min, very sedated; wet, congested coughing at times; jugular veins markedly distended on inspiration. Have paused dilaudid drip. Discussed with Dr Doylene Canard via phone & willo resume dilaudid drip if pt shows signs of discomfort or agitation. Grayer Sproles, [employer]

## 2014-06-16 LAB — CBC
HEMATOCRIT: 13.8 % — AB (ref 39.0–52.0)
Hemoglobin: 4.2 g/dL — CL (ref 13.0–17.0)
MCH: 25.3 pg — ABNORMAL LOW (ref 26.0–34.0)
MCHC: 30.4 g/dL (ref 30.0–36.0)
MCV: 83.1 fL (ref 78.0–100.0)
Platelets: 80 10*3/uL — ABNORMAL LOW (ref 150–400)
RBC: 1.66 MIL/uL — AB (ref 4.22–5.81)
RDW: 25 % — AB (ref 11.5–15.5)
WBC: 6.2 10*3/uL (ref 4.0–10.5)

## 2014-06-16 LAB — BASIC METABOLIC PANEL
ANION GAP: 13 (ref 5–15)
BUN: 26 mg/dL — ABNORMAL HIGH (ref 6–23)
CALCIUM: 8.5 mg/dL (ref 8.4–10.5)
CO2: 22 mEq/L (ref 19–32)
Chloride: 98 mEq/L (ref 96–112)
Creatinine, Ser: 1.73 mg/dL — ABNORMAL HIGH (ref 0.50–1.35)
GFR calc Af Amer: 42 mL/min — ABNORMAL LOW (ref >90)
GFR calc non Af Amer: 36 mL/min — ABNORMAL LOW (ref >90)
Glucose, Bld: 98 mg/dL (ref 70–99)
POTASSIUM: 3.8 meq/L (ref 3.7–5.3)
Sodium: 133 mEq/L — ABNORMAL LOW (ref 137–147)

## 2014-06-16 LAB — PROTIME-INR
INR: 2.16 — AB (ref 0.00–1.49)
Prothrombin Time: 24.3 seconds — ABNORMAL HIGH (ref 11.6–15.2)

## 2014-06-16 MED ORDER — SCOPOLAMINE 1 MG/3DAYS TD PT72
1.0000 | MEDICATED_PATCH | TRANSDERMAL | Status: DC
  Administered 2014-06-16: 1.5 mg via TRANSDERMAL
  Filled 2014-06-16: qty 1

## 2014-06-16 MED ORDER — DIPHENHYDRAMINE HCL 50 MG/ML IJ SOLN: 12.5000 mg | INTRAMUSCULAR | Status: DC | PRN

## 2014-06-16 MED ORDER — DEXTROSE 5 % IV SOLN
5.0000 mg | Freq: Once | INTRAVENOUS | Status: AC
  Administered 2014-06-16: 5 mg via INTRAVENOUS
  Filled 2014-06-16: qty 0.5

## 2014-06-16 MED ORDER — FUROSEMIDE 10 MG/ML IJ SOLN
40.0000 mg | Freq: Once | INTRAMUSCULAR | Status: AC
  Administered 2014-06-16: 40 mg via INTRAVENOUS
  Filled 2014-06-16: qty 4

## 2014-06-16 NOTE — Progress Notes (Signed)
Ref: [name], MD   Subjective:  Sedated. Family at bedside. T max 99.1 F. Worsening renal function.  Objective:  Vital Signs in the last 24 hours: Temp:  [97.6 F (36.4 C)-99.1 F (37.3 C)] 99.1 F (37.3 C) (11/30 0621) Pulse Rate:  [85-96] 96 (11/30 0621) Cardiac Rhythm:  [-]  Resp:  [8-15] 12 (11/30 0621) BP: (122-147)/(52-73) 122/52 mmHg (11/30 0621) SpO2:  [92 %-98 %] 92 % (11/30 1012) Weight:  [92.987 kg (205 lb)] 92.987 kg (205 lb) (11/29 1509)  Physical Exam: BP Readings from Last 1 Encounters:  06/16/14 122/52    Wt Readings from Last 1 Encounters:  05/30/2014 92.987 kg (205 lb)    Weight change:   HEENT: [place]/AT, Eyes-Brown, PERL, EOMI, Conjunctiva-Pink, Sclera-Non-icteric Neck: Full JVD, No bruit, Trachea midline. Lungs:  Coarse crackles, Bilateral. Cardiac:  Regular rhythm, normal S1 and S2, no S3.  Abdomen:  Soft, non-tender. Distended Extremities:  3 + edema present. No cyanosis. No clubbing. CNS: AxOx0, Cranial nerves grossly intact, moves all 4 extremities. Right handed. Skin: Warm and dry.   Intake/Output from previous day: 11/29 0701 - 11/30 0700 In: 370 [I.V.:370] Out: 100 [Urine:100]    Lab Results: BMET    Component Value Date/Time   NA 133* 06/16/2014 0446   NA 136* 05/21/2014 1040   NA 135* 05/09/2014 0501   K 3.8 06/16/2014 0446   K 3.5* 06/12/2014 1040   K 4.0 05/09/2014 0501   CL 98 06/16/2014 0446   CL 99 06/09/2014 1040   CL 99 05/09/2014 0501   CO2 22 06/16/2014 0446   CO2 23 06/05/2014 1040   CO2 24 05/09/2014 0501   GLUCOSE 98 06/16/2014 0446   GLUCOSE 102* 06/06/2014 1040   GLUCOSE 92 05/09/2014 0501   BUN 26* 06/16/2014 0446   BUN 19 05/29/2014 1040   BUN 29* 05/09/2014 0501   CREATININE 1.73* 06/16/2014 0446   CREATININE 1.01 05/20/2014 1040   CREATININE 1.23 05/09/2014 0501   CALCIUM 8.5 06/16/2014 0446   CALCIUM 8.3* 06/11/2014 1040   CALCIUM 8.9 05/09/2014 0501   GFRNONAA 36* 06/16/2014 0446   GFRNONAA 70*  05/31/2014 1040   GFRNONAA 55* 05/09/2014 0501   GFRAA 42* 06/16/2014 0446   GFRAA 81* 05/19/2014 1040   GFRAA 64* 05/09/2014 0501   CBC    Component Value Date/Time   WBC 6.2 06/16/2014 0446   RBC 1.66* 06/16/2014 0446   RBC 2.25* 03/28/2014 1859   HGB 4.2* 06/16/2014 0446   HCT 13.8* 06/16/2014 0446   PLT 80* 06/16/2014 0446   MCV 83.1 06/16/2014 0446   MCH 25.3* 06/16/2014 0446   MCHC 30.4 06/16/2014 0446   RDW 25.0* 06/16/2014 0446   LYMPHSABS 1.2 05/09/2014 0501   MONOABS 0.9 05/09/2014 0501   EOSABS 0.0 05/09/2014 0501   BASOSABS 0.0 05/09/2014 0501   HEPATIC Function Panel  Recent Labs  05/06/14 1516 05/09/14 0501 06/13/2014 1040  PROT 7.4 6.6 6.7   HEMOGLOBIN A1C No components found for: HGA1C,  MPG CARDIAC ENZYMES Lab Results  Component Value Date   TROPONINI <0.30 02/07/2013   BNP  Recent Labs  05/06/14 1516 05/07/14 1743  PROBNP 123.2 469.4*   TSH No results for input(s): TSH in the last 8760 hours. CHOLESTEROL No results for input(s): CHOL in the last 8760 hours.  Scheduled Meds: . albuterol  3 mL Inhalation BID  . furosemide  40 mg Oral Daily  . multivitamin with minerals  1 tablet Oral Daily  .  phytonadione (VITAMIN K) IV  5 mg Intravenous Once  . sodium chloride  3 mL Intravenous Q12H  . thiamine  100 mg Oral Daily   Continuous Infusions: . HYDROmorphone 1 mg/hr (06/16/14 0745)   PRN Meds:.sodium chloride, acetaminophen, albuterol, diphenhydrAMINE, LORazepam, ondansetron **OR** ondansetron (ZOFRAN) IV, sodium chloride  Assessment/Plan: Severe Anemia Acute on chronic GI blood loss Chronic left heart systolic failure Possible pneumonia CA prostate with metastasis Alcoholic Liver cirrhosis with abnormal liver function test Chronic alcohol abuse Weakness Thrombocytopenia H/O stroke/TIA Acute renal failure.  Try IV lasix + vit. K per family request.  Continue comfort care.   LOS: 1 day    Dixie Dials  MD  06/16/2014, 1:34  PM

## 2014-06-16 NOTE — Progress Notes (Signed)
Chaplain introduced spiritual care as resources and provided support with family at bedside.  Family anticipating a hospital death.   Pt's family member, Dewitt Hoes, familiar with spiritual care director Jeanella Craze from the death of her spouse at [facility].   Pt's sister and mother planning on staying in hospital. Shared prayers with family - who voiced prayers for peaceful transition.  Will continue to follow for support.   [place], [place]

## 2014-06-16 NOTE — Progress Notes (Signed)
Nutrition Brief Note  Chart reviewed. Pt now transitioning to comfort care.  No further nutrition interventions warranted at this time.  Please re-consult as needed.   Christine Schiefelbein, MS, RD, LDN Pager: [phone] After Hours Pager: [phone]    

## 2014-06-17 LAB — BASIC METABOLIC PANEL
ANION GAP: 18 — AB (ref 5–15)
BUN: 33 mg/dL — ABNORMAL HIGH (ref 6–23)
CALCIUM: 8.5 mg/dL (ref 8.4–10.5)
CHLORIDE: 102 meq/L (ref 96–112)
CO2: 19 mEq/L (ref 19–32)
CREATININE: 3.02 mg/dL — AB (ref 0.50–1.35)
GFR calc Af Amer: 21 mL/min — ABNORMAL LOW (ref >90)
GFR calc non Af Amer: 19 mL/min — ABNORMAL LOW (ref >90)
Glucose, Bld: 85 mg/dL (ref 70–99)
Potassium: 4.2 mEq/L (ref 3.7–5.3)
Sodium: 139 mEq/L (ref 137–147)

## 2014-06-17 LAB — PROTIME-INR
INR: 2.34 — AB (ref 0.00–1.49)
Prothrombin Time: 25.8 seconds — ABNORMAL HIGH (ref 11.6–15.2)

## 2014-06-17 DEATH — deceased

## 2014-06-18 NOTE — Progress Notes (Signed)
Patient is DNR, Patients expired, he was on Dilaudid drip IV, upon expiration nursed was witnessed wasted in sink  dilaudid 45cc that remained with Maryla Morrow, RN., Cyndia Diver, RN.

## 2014-06-18 NOTE — Progress Notes (Signed)
Patient is a DNR.  Patient family called nurse into room.  On assessment found patient with no respiration, no pulse and no BP.  Notified Dr. Doylene Canard  That patient expired at  23:45  hrs on July 12, 2014  and ordered were received to pronounce and release to funeral home. [place] [facility] also notified, Patient was ruled out for eye donation due to age and declined organs due to mets cancer.  Cyndia Diver., RN

## 2014-06-18 NOTE — Discharge Summary (Signed)
Physician Death Summary  Patient ID: Devin Parker MRN: [id number] DOB/AGE: 1935-02-20 79 y.o.  Admit date: 22-Jun-2014 Death/discharge date: Jun 24, 2014  Admission Diagnoses: Severe Anemia Acute on chronic GI blood loss Chronic left heart systolic failure Possible pneumonia CA prostate with metastasis Alcoholic Liver cirrhosis with abnormal liver function test Chronic alcohol abuse Weakness Thrombocytopenia H/O stroke/TIA Acute renal failure.  Discharge Diagnoses:  Principle Problem: * Severe Anemia * Acute on chronic GI blood loss Chronic left heart systolic failure Possible pneumonia CA prostate with metastasis Alcoholic Liver cirrhosis with abnormal liver function test Chronic alcohol abuse Weakness Thrombocytopenia H/O stroke/TIA Acute renal failure.  Discharged Condition: Expired at 23:45 hrs on 17 Jun 2014  Hospital Course: 79 year old hospice patient with PMH of CVA, prostate CA, HTN and alcohol abuse had recurrent bleed resulting in severe anemia. Patient had refused blood transfusions in the past and on day of admission. He also had chronic alcoholic cirrhosis with elevated INR without medications. He also had significant bilateral leg edema and small left pleural effusions and possible air-space disease. Wife and patient were in agreement with comfort care. No cough, fever or elevated WBC count. Wife had noticed recurrent GI bleed for long time. He was on IV dilaudid and lorazepam for 2-3 days and expired at 23:45 hrs on 06/24/2014. He was pronounced dead by nurse and appropriate disposition to funeral home was arranged.  Consults: None  Significant Diagnostic Studies: labs: Near normal BMET on admission with deteriorating BUN/Cr to 33/3.02. Hgb was down to 4.2 g/dL  Treatments: IV dilaudid drip and IV lorazepam.  Discharge Exam: Blood pressure 0, pulse 0, temperature 98.6 F (37 C), temperature source Axillary, resp. rate 0, height 6\' 5"  (1.956 m),  weight 92.987 kg (205 lb). HEENT: [place]/AT, Eyes-Brown, Conjunctiva-Pink, Sclera-Non-icteric Neck: Full JVD, No bruit, Trachea midline. Lungs: No air movement. Cardiac: No heart rate, pulse.  Abdomen: Soft, Distended Extremities: 3 + edema present. No cyanosis. No clubbing. CNS: AxOx0,  Skin: Warm and dry.  Disposition: Funeral home.     Medication List    ASK your doctor about these medications        acetaminophen 500 MG tablet  Commonly known as:  TYLENOL  Take 500 mg by mouth every 6 (six) hours as needed for moderate pain.     albuterol (2.5 MG/3ML) 0.083% nebulizer solution  Commonly known as:  PROVENTIL  Inhale 3 mLs into the lungs 2 (two) times daily.     cholecalciferol 1000 UNITS tablet  Commonly known as:  VITAMIN D  Take 1,000 Units by mouth every other day.     cyanocobalamin 500 MCG tablet  Take 500 mcg by mouth every other day. Vitamin B12     ENSURE PLUS Liqd  Take 237 mLs by mouth 2 (two) times daily between meals.     Fish Oil 500 MG Caps  Take 500 mg by mouth 2 (two) times a week.     furosemide 40 MG tablet  Commonly known as:  LASIX  Take 1 tablet (40 mg total) by mouth daily.     Garlic 124 MG Tabs  Take 200 mg by mouth 2 (two) times a week.     levofloxacin 500 MG tablet  Commonly known as:  LEVAQUIN  Take 1 tablet (500 mg total) by mouth daily.     multivitamin with minerals Tabs tablet  Take 1 tablet by mouth daily.     OVER THE COUNTER MEDICATION  Apply 1 application topically  daily as needed (puts on chest and neck for wheezing help).     OXYGEN  Inhale 2 L into the lungs as needed (doesn't use daily).     phytonadione 5 MG tablet  Commonly known as:  VITAMIN K  Take 1 tablet (5 mg total) by mouth once a week.         SignedDixie Dials S 06/18/2014, 12:53 AM

## 2014-06-18 NOTE — Progress Notes (Signed)
Chaplain paged at [phone] to care for the family of Mr Devin Parker. Mr Devin Parker had just died. Chaplain followed up on care provided by daytime chaplains. Family was greatly comforted by [facility] care and was appreciative of further care after Mr Devin Parker's death. Family voice deep appreciation to the staff of [facility] [place] for their compassionate care.   By all reports Mr Devin Parker was a fine person, hard worker and loving husband and father.  Family Devin Parker arrived and provided further spiritual and grief care to family. All reported that Devin Parker, the wife of the pt, will be supported lovingly by her community of faith.  Prayers and grief care provided. Daughters were comforted in their loss which was especially heavy for them.  Again, family wishes to state their appreciation for all the staff did to support and care for them.  Devin Parker. Devin Parker, Devin Parker

## 2014-07-18 NOTE — Progress Notes (Signed)
Ref: [name], MD   Subjective:  Sedated, congested. Further deterioration of renal function with Cr. 3.02  Objective:  Vital Signs in the last 24 hours: Temp:  [98.6 F (37 C)] 98.6 F (37 C) (11/30 1859) Pulse Rate:  [105-109] 105 (12/01 1700) Cardiac Rhythm:  [-]  Resp:  [6-12] 6 (12/01 1700) BP: (77-152)/(39-65) 77/39 mmHg (12/01 1700) SpO2:  [77 %-99 %] 99 % (12/01 1700)  Physical Exam: BP Readings from Last 1 Encounters:  07-09-14 77/39    Wt Readings from Last 1 Encounters:  05/27/2014 92.987 kg (205 lb)    Weight change:   HEENT: [place]/AT, Eyes-Brown, PERL, EOMI, Conjunctiva-Pink, Sclera-Non-icteric Neck: No JVD, No bruit, Trachea midline. Lungs:  Clear, Bilateral. Cardiac:  Regular rhythm, normal S1 and S2, no S3.  Abdomen:  Soft, non-tender. Extremities:  3 + edema present. No cyanosis. No clubbing. CNS: AxOx0, Cranial nerves grossly intact, moves all 4 extremities. . Skin: Warm and dry.   Intake/Output from previous day: 11/30 0701 - 12/01 0700 In: 339.8 [I.V.:289.8; IV Piggyback:50] Out: 0     Lab Results: BMET    Component Value Date/Time   NA 139 2014-07-09 0405   NA 133* 06/16/2014 0446   NA 136* 05/27/2014 1040   K 4.2 07-09-14 0405   K 3.8 06/16/2014 0446   K 3.5* 05/23/2014 1040   CL 102 Jul 09, 2014 0405   CL 98 06/16/2014 0446   CL 99 05/24/2014 1040   CO2 19 07-09-2014 0405   CO2 22 06/16/2014 0446   CO2 23 06/08/2014 1040   GLUCOSE 85 07-09-2014 0405   GLUCOSE 98 06/16/2014 0446   GLUCOSE 102* 05/23/2014 1040   BUN 33* Jul 09, 2014 0405   BUN 26* 06/16/2014 0446   BUN 19 06/12/2014 1040   CREATININE 3.02* 2014-07-09 0405   CREATININE 1.73* 06/16/2014 0446   CREATININE 1.01 05/18/2014 1040   CALCIUM 8.5 07-09-14 0405   CALCIUM 8.5 06/16/2014 0446   CALCIUM 8.3* 05/24/2014 1040   GFRNONAA 19* 2014-07-09 0405   GFRNONAA 36* 06/16/2014 0446   GFRNONAA 70* 06/05/2014 1040   GFRAA 21* Jul 09, 2014 0405   GFRAA 42* 06/16/2014 0446    GFRAA 81* 06/10/2014 1040   CBC    Component Value Date/Time   WBC 6.2 06/16/2014 0446   RBC 1.66* 06/16/2014 0446   RBC 2.25* 03/28/2014 1859   HGB 4.2* 06/16/2014 0446   HCT 13.8* 06/16/2014 0446   PLT 80* 06/16/2014 0446   MCV 83.1 06/16/2014 0446   MCH 25.3* 06/16/2014 0446   MCHC 30.4 06/16/2014 0446   RDW 25.0* 06/16/2014 0446   LYMPHSABS 1.2 05/09/2014 0501   MONOABS 0.9 05/09/2014 0501   EOSABS 0.0 05/09/2014 0501   BASOSABS 0.0 05/09/2014 0501   HEPATIC Function Panel  Recent Labs  05/06/14 1516 05/09/14 0501 05/18/2014 1040  PROT 7.4 6.6 6.7   HEMOGLOBIN A1C No components found for: HGA1C,  MPG CARDIAC ENZYMES Lab Results  Component Value Date   TROPONINI <0.30 02/07/2013   BNP  Recent Labs  05/06/14 1516 05/07/14 1743  PROBNP 123.2 469.4*   TSH No results for input(s): TSH in the last 8760 hours. CHOLESTEROL No results for input(s): CHOL in the last 8760 hours.  Scheduled Meds: . albuterol  3 mL Inhalation BID  . furosemide  40 mg Oral Daily  . multivitamin with minerals  1 tablet Oral Daily  . scopolamine  1 patch Transdermal Q72H  . sodium chloride  3 mL Intravenous Q12H  .  thiamine  100 mg Oral Daily   Continuous Infusions: . 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  Continue comfort care.     LOS: 2 days    Dixie Dials  MD  07/11/14, 5:35 PM

## 2014-07-18 DEATH — deceased

## 2016-03-08 IMAGING — CT CT HEAD W/O CM
1 series · 16 of 30 positions shown, 20 images · non-contrast
Comparison: CT scans of the head dated 03/28/2014 and 03/26/2010

CLINICAL DATA: Intracranial hemorrhage.

EXAM:
CT HEAD WITHOUT CONTRAST
TECHNIQUE: Contiguous axial images were obtained from the base of the skull
through the vertex without intravenous contrast.

[Series 2: head 5.0 h30s · axial · 0.45mm/px · z∈[-202,-67]mm · 16 of 30 slices shown, 20 images]
[im 2/30  brain]
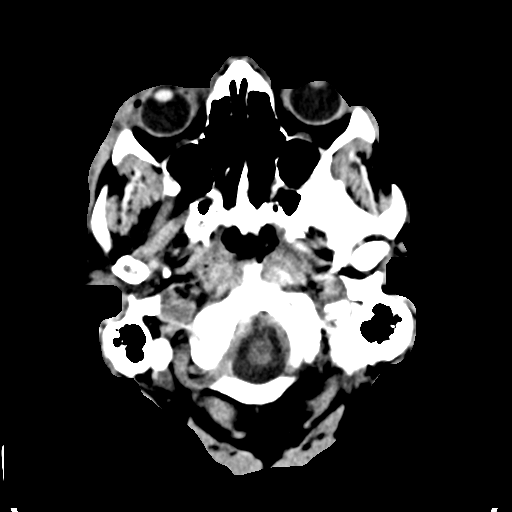
[im 2/30  bone]
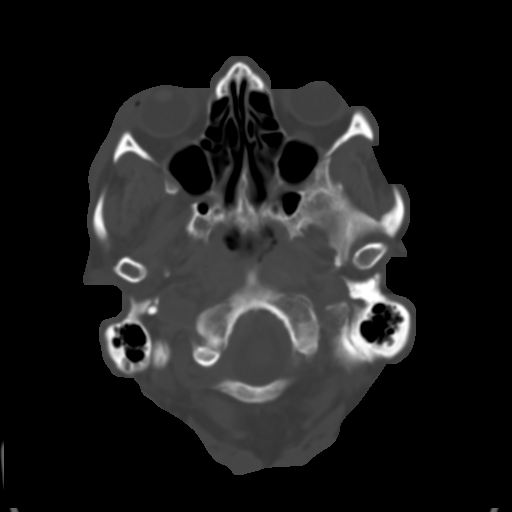
[im 4/30  brain]
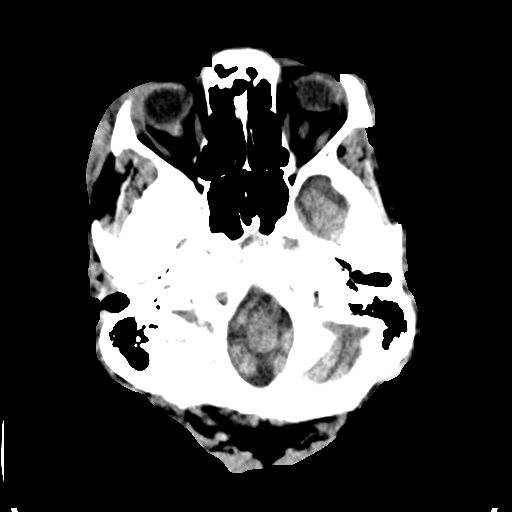
[im 6/30  brain]
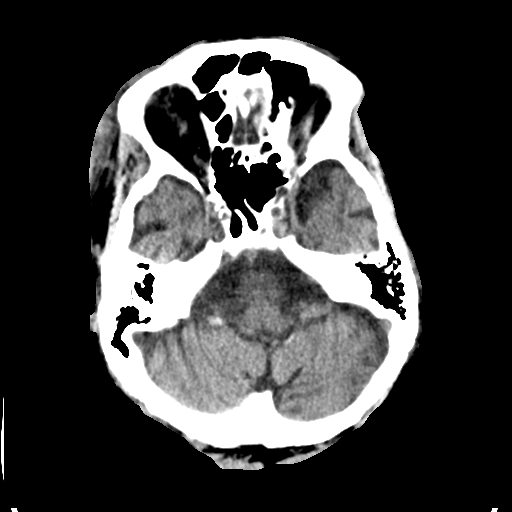
[im 8/30  brain]
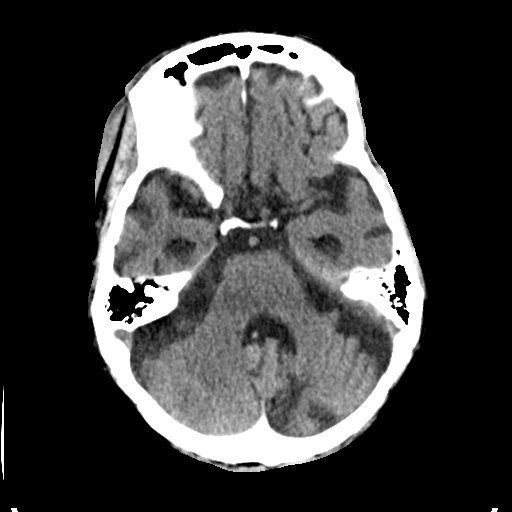
[im 9/30  brain]
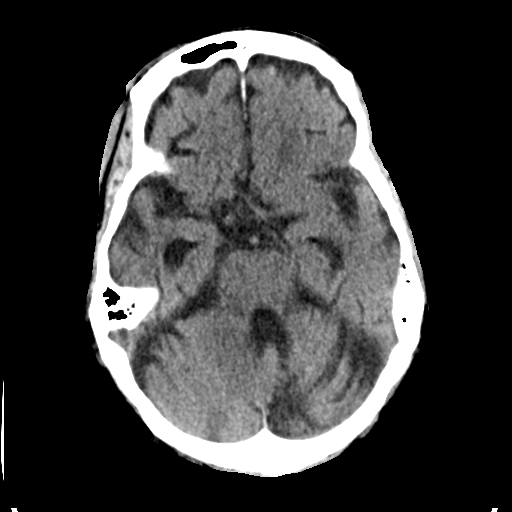
[im 9/30  bone]
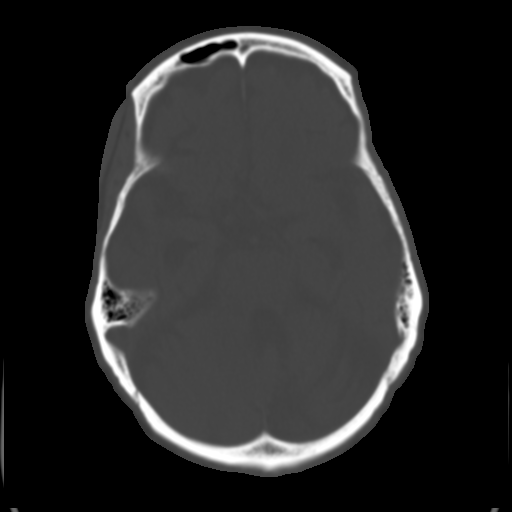
[im 11/30  brain]
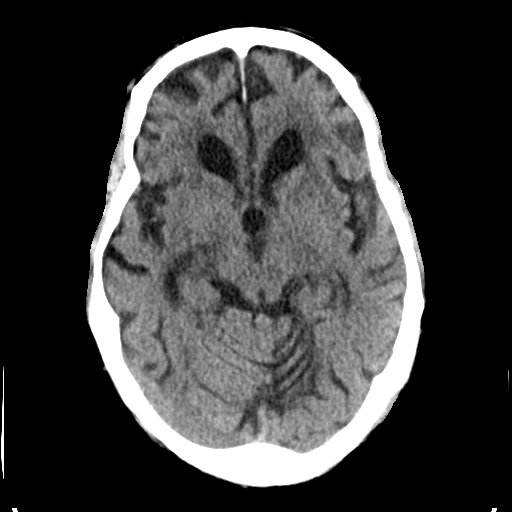
[im 13/30  brain]
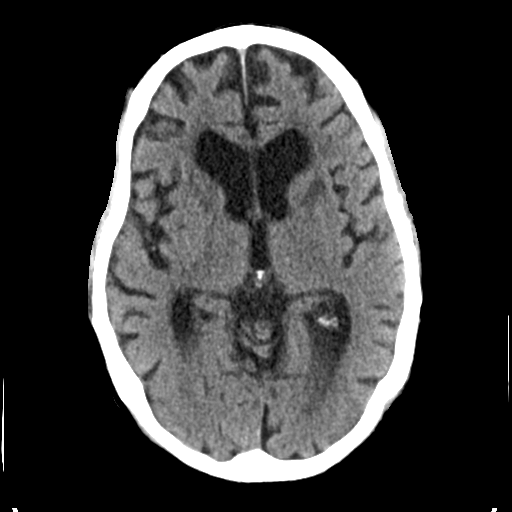
[im 15/30  brain]
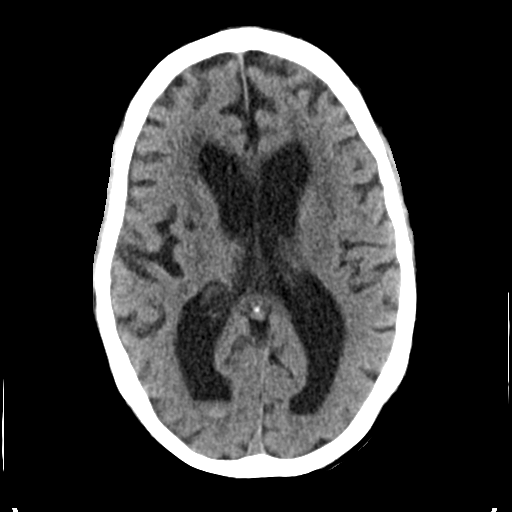
[im 16/30  brain]
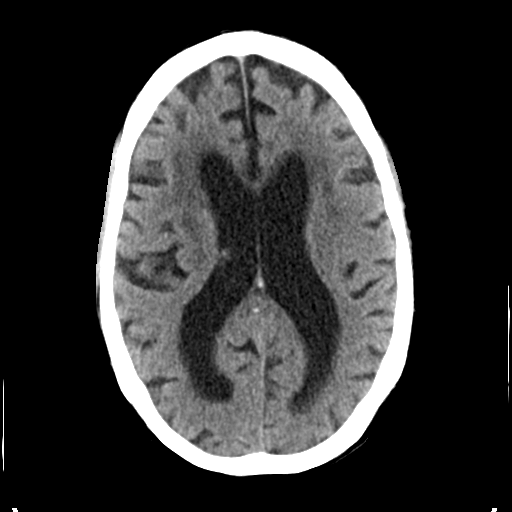
[im 16/30  bone]
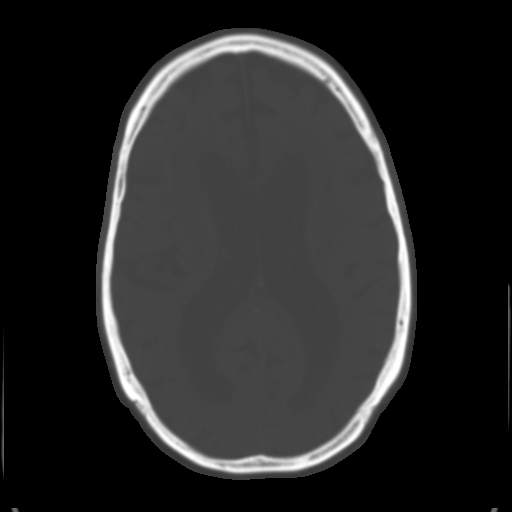
[im 18/30  brain]
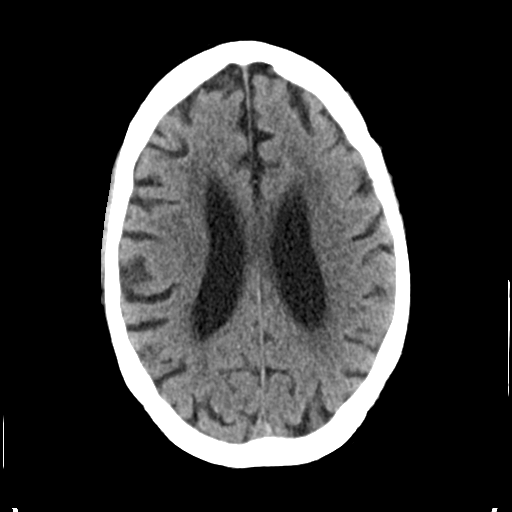
[im 20/30  brain]
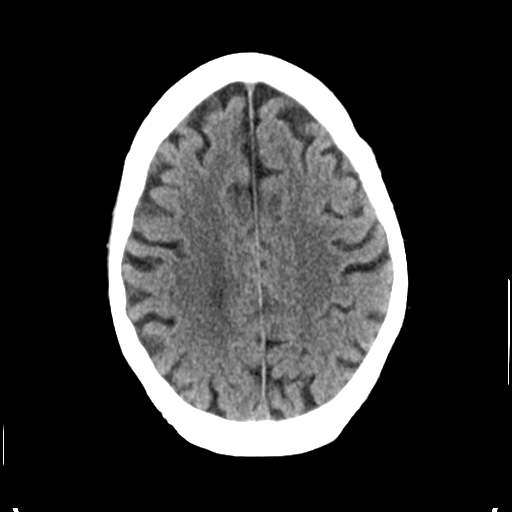
[im 22/30  brain]
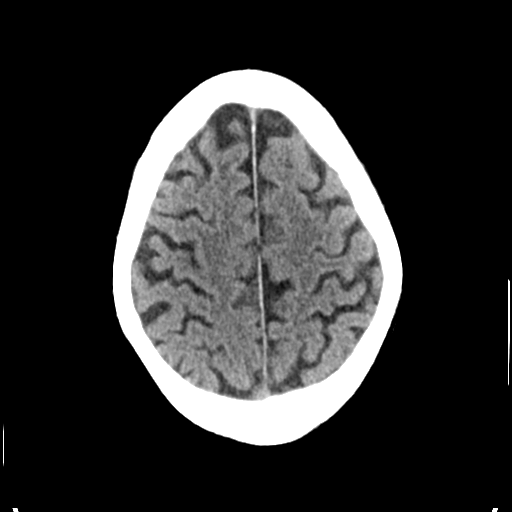
[im 23/30  brain]
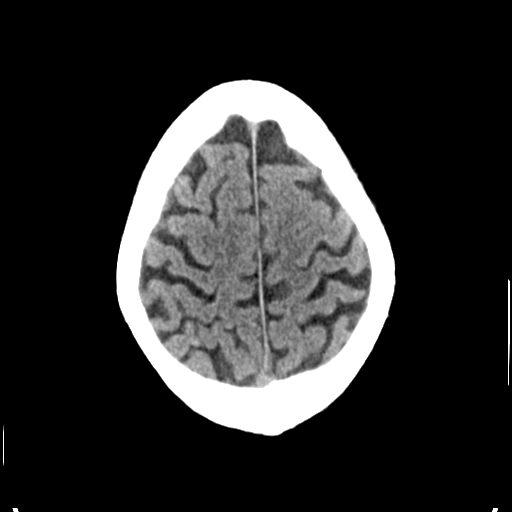
[im 23/30  bone]
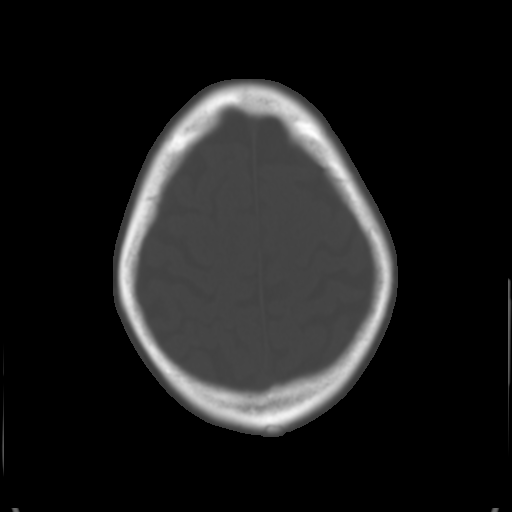
[im 25/30  brain]
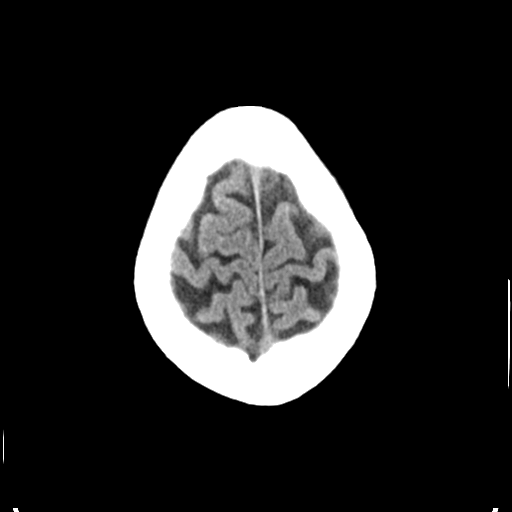
[im 27/30  brain]
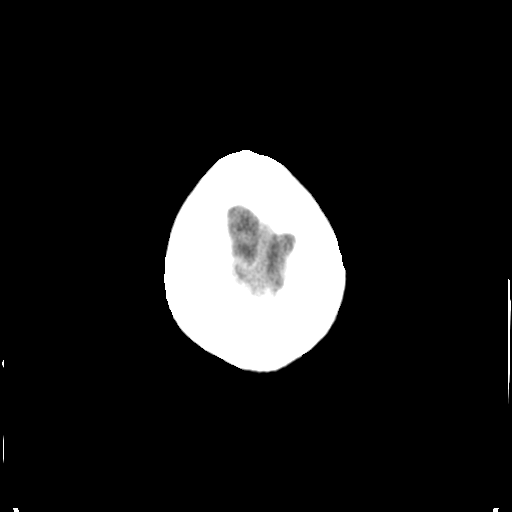
[im 29/30  brain]
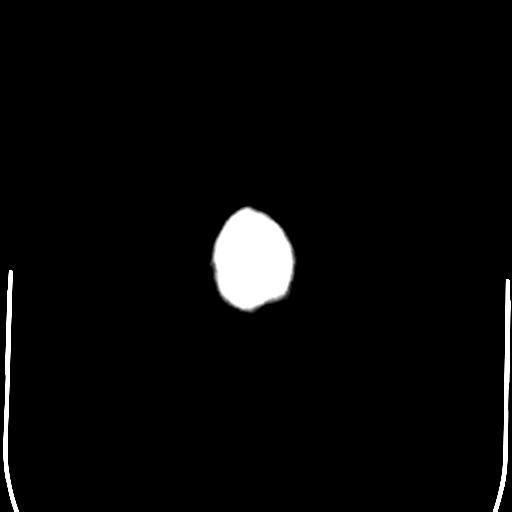

[16 of 30 positions shown; findings below may reference images not displayed]

FINDINGS: The blood at the septum pellucidum seen on the prior study has now
moved to the occipital horns of both lateral ventricles.

No other change. Diffuse cerebral cortical and cerebellar atrophy,
most severe in the left cerebellar hemisphere. Chronic small vessel
ischemic changes in both cerebral hemispheres, primarily in the
frontal lobes and basal ganglia. The soft tissue hematoma in the
scalp adjacent to the right superior orbital rim is slightly more
prominent.
IMPRESSION: 1. No new hemorrhage. Blood that was present on the septum
pellucidum is now in the occipital horns of the lateral ventricles.
2. Slight increased scalp hematoma along the lateral wall of the
right orbit.

## 2016-05-25 IMAGING — CR DG CHEST 1V PORT
1 series · 1 of 1 positions shown · non-contrast
Comparison: 05/07/2014.

CLINICAL DATA: Shortness of breath today.  Ex-smoker.

EXAM:
PORTABLE CHEST - 1 VIEW

[x chest ap]
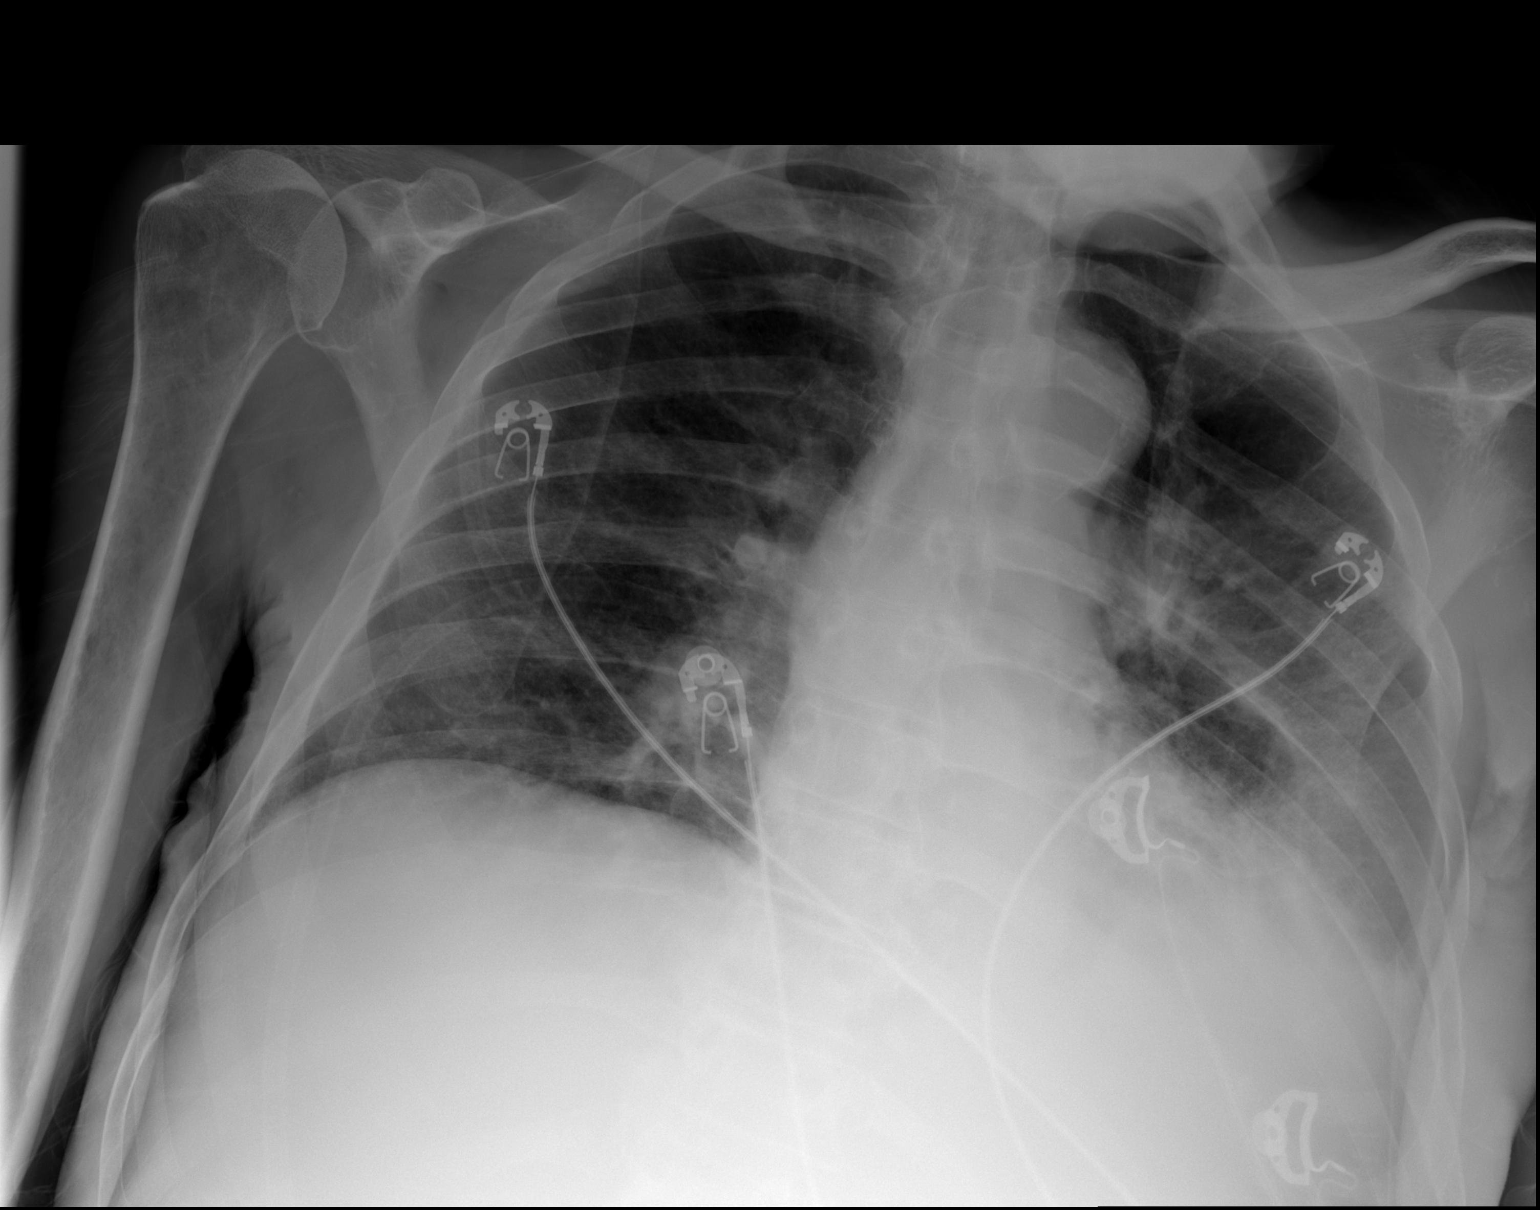

[1 of 1 positions shown; findings below may reference images not displayed]

FINDINGS: Normal sized heart. Increased left basilar airspace opacity with
interval opacity in the left mid lung zone. Minimal right basilar
atelectasis. Small left pleural effusion. Unremarkable bones.
IMPRESSION: 1. Increased left lung probable pneumonia or postobstructive
changes.
2. Small left pleural effusion.
3. Minimal right basilar atelectasis.
